# Patient Record
Sex: Female | Born: 1985 | Hispanic: No | Marital: Married | Smoking: Never smoker
Health system: Southern US, Community
[De-identification: ages and names within clinical notes are randomized; demographics above are authoritative.]

## PROBLEM LIST (undated history)

## (undated) DIAGNOSIS — T7840XA Allergy, unspecified, initial encounter: Secondary | ICD-10-CM

## (undated) DIAGNOSIS — F32A Depression, unspecified: Secondary | ICD-10-CM

## (undated) DIAGNOSIS — F329 Major depressive disorder, single episode, unspecified: Secondary | ICD-10-CM

## (undated) DIAGNOSIS — F419 Anxiety disorder, unspecified: Secondary | ICD-10-CM

## (undated) DIAGNOSIS — R87629 Unspecified abnormal cytological findings in specimens from vagina: Secondary | ICD-10-CM

## (undated) HISTORY — DX: Unspecified abnormal cytological findings in specimens from vagina: R87.629

## (undated) HISTORY — DX: Anxiety disorder, unspecified: F41.9

## (undated) HISTORY — DX: Allergy, unspecified, initial encounter: T78.40XA

## (undated) HISTORY — DX: Major depressive disorder, single episode, unspecified: F32.9

## (undated) HISTORY — PX: WISDOM TOOTH EXTRACTION: SHX21

## (undated) HISTORY — DX: Depression, unspecified: F32.A

---

## 2012-06-15 ENCOUNTER — Ambulatory Visit (INDEPENDENT_AMBULATORY_CARE_PROVIDER_SITE_OTHER): Payer: No Typology Code available for payment source | Admitting: Family Medicine

## 2012-06-15 VITALS — BP 122/76 | HR 71 | Temp 98.6°F | Resp 16 | Ht 62.8 in | Wt 117.0 lb

## 2012-06-15 DIAGNOSIS — Z23 Encounter for immunization: Secondary | ICD-10-CM

## 2012-06-15 DIAGNOSIS — Z789 Other specified health status: Secondary | ICD-10-CM

## 2012-06-15 MED ORDER — TETANUS-DIPHTH-ACELL PERTUSSIS 5-2.5-18.5 LF-MCG/0.5 IM SUSP: 0.5000 mL | Freq: Once | INTRAMUSCULAR | Status: DC

## 2012-06-15 MED ORDER — HEPATITIS A VACCINE 1440 EL U/ML IM SUSP: 1.0000 mL | Freq: Once | INTRAMUSCULAR | Status: DC

## 2012-06-15 MED ORDER — TYPHOID VACCINE PO CPDR: 1.0000 | DELAYED_RELEASE_CAPSULE | ORAL | Status: DC

## 2012-06-15 MED ORDER — CHLOROQUINE PHOSPHATE 500 MG PO TABS: ORAL_TABLET | ORAL | Status: DC

## 2012-06-15 NOTE — Progress Notes (Signed)
[facility] [address] [place] [address] [phone]  Date:  06/15/2012   Name:  Daveah Varone   DOB:  Apr 18, 1985   MRN:  [id number]  PCP:  Nilda Simmer, MD    Chief Complaint: Immunizations   History of Present Illness:  Susette Seminara is a 27 y.o. very pleasant female patient who presents with the following:  She is leaving for [place] on a mission trip on 07/03/12.  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 [place]   She has no drug allergies.  She was recently diagnosed with Raynaud's syndrome- thought to be primary and not due to any other problem . She was given a BP medication, but decided not to take it because her symptoms are not severe unless her hands get cold.    Her shots were updated prior to college  She has had the Hep B series, and gardasil.  She is unsure of the date of her last tetanus shot.    LMP 05/18/12  There is no problem list on file for this patient.   Past Medical History  Diagnosis Date  . Allergy   . Depression   . Anxiety     Past Surgical History  Procedure Laterality Date  . Wisdom tooth extraction      History  Substance Use Topics  . Smoking status: Never Smoker   . Smokeless tobacco: Not on file  . Alcohol Use: Yes    Family History  Problem Relation Age of Onset  . Hypertension Father   . Cancer Maternal Grandmother   . Liver disease Maternal Grandfather   . Emphysema Paternal Grandmother   . Lung cancer Paternal Grandfather     No Known Allergies  Medication list has been reviewed and updated.  No current outpatient prescriptions on file prior to visit.   No current facility-administered medications on file prior to visit.    Review of Systems:  As per HPI- otherwise negative. Reports she was told to get malaria prophylaxis, and would like typhoid prophylaxis.    Physical  Examination: Filed Vitals:   06/15/12 0813  BP: 122/76  Pulse: 71  Temp: 98.6 F (37 C)  Resp: 16   Filed Vitals:   06/15/12 0813  Height: 5' 2.8" (1.595 m)  Weight: 117 lb (53.071 kg)   Body mass index is 20.86 kg/(m^2). Ideal Body Weight: Weight in (lb) to have BMI = 25: 139.9  GEN: WDWN, NAD, Non-toxic, A & O x 3 HEENT: Atraumatic, Normocephalic. Neck supple. No masses, No LAD. Ears and Nose: No external deformity. CV: RRR, No M/G/R. No JVD. No thrill. No extra heart sounds. PULM: CTA B, no wheezes, crackles, rhonchi. No retractions. No resp. distress. No accessory muscle use. EXTR: No c/c/e NEURO Normal gait.  PSYCH: Normally interactive. Conversant. Not depressed or anxious appearing.  Calm demeanor.    Assessment and Plan: Travel foreign - Plan: typhoid (VIVOTIF) DR capsule, hepatitis A virus (PF) vaccine (HAVRIX (PF)) 1440 EL U/ML injection 1,440 Units, TDaP (BOOSTRIX) injection 0.5 mL, chloroquine (ARALEN) 500 MG tablet  Updated Tdap today as she is not sure of the date of her last shot.  Hep A #1 given- she is aware this is a 2 shot series Rx oral typhoid vaccine and malaria prophylaxis with chloroquine.  Advised to avoid contact with wild or domestic animals.  Meds ordered this encounter  Medications  . etonogestrel-ethinyl estradiol (NUVARING) 0.12-0.015 MG/24HR vaginal ring    Sig: Place 1 each vaginally every 28 (twenty-eight) days. Insert vaginally and leave in place for 3 consecutive weeks, then remove for 1 week.  . typhoid (VIVOTIF) DR capsule    Sig: Take 1 capsule by mouth every other day.    Dispense:  4 capsule    Refill:  0  . hepatitis A virus (PF) vaccine (HAVRIX (PF)) 1440 EL U/ML injection 1,440 Units    Sig:   . TDaP (BOOSTRIX) injection 0.5 mL    Sig:   . chloroquine (ARALEN) 500 MG tablet    Sig: Take 1 tablet weekly, begin 2 weeks prior to travel and complete 8 weeks after travel. Take for 4 weeks after returning home    Dispense:  10  tablet    Refill:  0    Signed Abbe Amsterdam, MD

## 2012-06-15 NOTE — Patient Instructions (Addendum)
Start on the oral typhoid vaccine right away.  Start the chloroquine one or two weeks prior to travel.  Continue for FOUR weeks after getting home

## 2012-12-09 ENCOUNTER — Telehealth: Payer: Self-pay

## 2012-12-09 ENCOUNTER — Ambulatory Visit (INDEPENDENT_AMBULATORY_CARE_PROVIDER_SITE_OTHER): Payer: No Typology Code available for payment source | Admitting: Internal Medicine

## 2012-12-09 VITALS — BP 116/70 | HR 73 | Temp 99.1°F | Resp 16 | Ht 62.5 in | Wt 121.6 lb

## 2012-12-09 DIAGNOSIS — IMO0002 Reserved for concepts with insufficient information to code with codable children: Secondary | ICD-10-CM

## 2012-12-09 DIAGNOSIS — B379 Candidiasis, unspecified: Secondary | ICD-10-CM

## 2012-12-09 DIAGNOSIS — R8271 Bacteriuria: Secondary | ICD-10-CM

## 2012-12-09 DIAGNOSIS — R6889 Other general symptoms and signs: Secondary | ICD-10-CM

## 2012-12-09 DIAGNOSIS — H9210 Otorrhea, unspecified ear: Secondary | ICD-10-CM

## 2012-12-09 DIAGNOSIS — Z0000 Encounter for general adult medical examination without abnormal findings: Secondary | ICD-10-CM

## 2012-12-09 DIAGNOSIS — H9221 Otorrhagia, right ear: Secondary | ICD-10-CM

## 2012-12-09 DIAGNOSIS — J45990 Exercise induced bronchospasm: Secondary | ICD-10-CM

## 2012-12-09 DIAGNOSIS — Z23 Encounter for immunization: Secondary | ICD-10-CM

## 2012-12-09 LAB — POCT CBC
Granulocyte percent: 58.9 %G (ref 37–80)
HCT, POC: 43.2 % (ref 37.7–47.9)
Hemoglobin: 13.8 g/dL (ref 12.2–16.2)
Lymph, poc: 2 (ref 0.6–3.4)
MCHC: 31.9 g/dL (ref 31.8–35.4)
MPV: 8.6 fL (ref 0–99.8)
POC Granulocyte: 3.3 (ref 2–6.9)
POC MID %: 5.8 %M (ref 0–12)
RDW, POC: 13.5 %

## 2012-12-09 LAB — POCT URINALYSIS DIPSTICK
Bilirubin, UA: NEGATIVE
Glucose, UA: NEGATIVE
Spec Grav, UA: 1.025
Urobilinogen, UA: 0.2

## 2012-12-09 LAB — COMPREHENSIVE METABOLIC PANEL
AST: 17 U/L (ref 0–37)
Albumin: 4.5 g/dL (ref 3.5–5.2)
BUN: 11 mg/dL (ref 6–23)
CO2: 25 mEq/L (ref 19–32)
Calcium: 9.9 mg/dL (ref 8.4–10.5)
Chloride: 102 mEq/L (ref 96–112)
Glucose, Bld: 77 mg/dL (ref 70–99)
Potassium: 4.5 mEq/L (ref 3.5–5.3)

## 2012-12-09 LAB — POCT UA - MICROSCOPIC ONLY
Casts, Ur, LPF, POC: NEGATIVE
Mucus, UA: POSITIVE

## 2012-12-09 LAB — POCT WET PREP WITH KOH: Yeast Wet Prep HPF POC: NEGATIVE

## 2012-12-09 LAB — GLUCOSE, POCT (MANUAL RESULT ENTRY): POC Glucose: 69 mg/dl — AB (ref 70–99)

## 2012-12-09 LAB — LIPID PANEL
Cholesterol: 170 mg/dL (ref 0–200)
HDL: 76 mg/dL (ref >39)

## 2012-12-09 MED ORDER — CIPROFLOXACIN HCL 500 MG PO TABS: 500.0000 mg | ORAL_TABLET | Freq: Two times a day (BID) | ORAL | Status: DC

## 2012-12-09 MED ORDER — FLUCONAZOLE 150 MG PO TABS: 150.0000 mg | ORAL_TABLET | Freq: Once | ORAL | Status: DC

## 2012-12-09 MED ORDER — ETONOGESTREL-ETHINYL ESTRADIOL 0.12-0.015 MG/24HR VA RING: VAGINAL_RING | VAGINAL | Status: DC

## 2012-12-09 MED ORDER — ALBUTEROL SULFATE HFA 108 (90 BASE) MCG/ACT IN AERS: 2.0000 | INHALATION_SPRAY | Freq: Four times a day (QID) | RESPIRATORY_TRACT | Status: DC | PRN

## 2012-12-09 NOTE — Progress Notes (Signed)
  Subjective:    Patient ID: Jean Dickerson, female    DOB: 1986-01-01, 27 y.o.   MRN: [id number]  HPI    Review of Systems     Objective:   Physical Exam        Assessment & Plan:

## 2012-12-09 NOTE — Patient Instructions (Addendum)
Health Maintenance, Females A healthy lifestyle and preventative care can promote health and wellness.  Maintain regular health, dental, and eye exams.  Eat a healthy diet. Foods like vegetables, fruits, whole grains, low-fat dairy products, and lean protein foods contain the nutrients you need without too many calories. Decrease your intake of foods high in solid fats, added sugars, and salt. Get information about a proper diet from your caregiver, if necessary.  Regular physical exercise is one of the most important things you can do for your health. Most adults should get at least 150 minutes of moderate-intensity exercise (any activity that increases your heart rate and causes you to sweat) each week. In addition, most adults need muscle-strengthening exercises on 2 or more days a week.   Maintain a healthy weight. The body mass index (BMI) is a screening tool to identify possible weight problems. It provides an estimate of body fat based on height and weight. Your caregiver can help determine your BMI, and can help you achieve or maintain a healthy weight. For adults 20 years and older:  A BMI below 18.5 is considered underweight.  A BMI of 18.5 to 24.9 is normal.  A BMI of 25 to 29.9 is considered overweight.  A BMI of 30 and above is considered obese.  Maintain normal blood lipids and cholesterol by exercising and minimizing your intake of saturated fat. Eat a balanced diet with plenty of fruits and vegetables. Blood tests for lipids and cholesterol should begin at age 20 and be repeated every 5 years. If your lipid or cholesterol levels are high, you are over 50, or you are a high risk for heart disease, you may need your cholesterol levels checked more frequently.Ongoing high lipid and cholesterol levels should be treated with medicines if diet and exercise are not effective.  If you smoke, find out from your caregiver how to quit. If you do not use tobacco, do not start.  If you  are pregnant, do not drink alcohol. If you are breastfeeding, be very cautious about drinking alcohol. If you are not pregnant and choose to drink alcohol, do not exceed 1 drink per day. One drink is considered to be 12 ounces (355 mL) of beer, 5 ounces (148 mL) of wine, or 1.5 ounces (44 mL) of liquor.  Avoid use of street drugs. Do not share needles with anyone. Ask for help if you need support or instructions about stopping the use of drugs.  High blood pressure causes heart disease and increases the risk of stroke. Blood pressure should be checked at least every 1 to 2 years. Ongoing high blood pressure should be treated with medicines, if weight loss and exercise are not effective.  If you are 55 to 26 years old, ask your caregiver if you should take aspirin to prevent strokes.  Diabetes screening involves taking a blood sample to check your fasting blood sugar level. This should be done once every 3 years, after age 45, if you are within normal weight and without risk factors for diabetes. Testing should be considered at a younger age or be carried out more frequently if you are overweight and have at least 1 risk factor for diabetes.  Breast cancer screening is essential preventative care for women. You should practice "breast self-awareness." This means understanding the normal appearance and feel of your breasts and may include breast self-examination. Any changes detected, no matter how small, should be reported to a caregiver. Women in their 20s and 30s should have   a clinical breast exam (CBE) by a caregiver as part of a regular health exam every 1 to 3 years. After age 40, women should have a CBE every year. Starting at age 40, women should consider having a mammogram (breast X-ray) every year. Women who have a family history of breast cancer should talk to their caregiver about genetic screening. Women at a high risk of breast cancer should talk to their caregiver about having an MRI and a  mammogram every year.  The Pap test is a screening test for cervical cancer. Women should have a Pap test starting at age 21. Between ages 21 and 29, Pap tests should be repeated every 2 years. Beginning at age 30, you should have a Pap test every 3 years as long as the past 3 Pap tests have been normal. If you had a hysterectomy for a problem that was not cancer or a condition that could lead to cancer, then you no longer need Pap tests. If you are between ages 65 and 70, and you have had normal Pap tests going back 10 years, you no longer need Pap tests. If you have had past treatment for cervical cancer or a condition that could lead to cancer, you need Pap tests and screening for cancer for at least 20 years after your treatment. If Pap tests have been discontinued, risk factors (such as a new sexual partner) need to be reassessed to determine if screening should be resumed. Some women have medical problems that increase the chance of getting cervical cancer. In these cases, your caregiver may recommend more frequent screening and Pap tests.  The human papillomavirus (HPV) test is an additional test that may be used for cervical cancer screening. The HPV test looks for the virus that can cause the cell changes on the cervix. The cells collected during the Pap test can be tested for HPV. The HPV test could be used to screen women aged 30 years and older, and should be used in women of any age who have unclear Pap test results. After the age of 30, women should have HPV testing at the same frequency as a Pap test.  Colorectal cancer can be detected and often prevented. Most routine colorectal cancer screening begins at the age of 50 and continues through age 75. However, your caregiver may recommend screening at an earlier age if you have risk factors for colon cancer. On a yearly basis, your caregiver may provide home test kits to check for hidden blood in the stool. Use of a small camera at the end of a  tube, to directly examine the colon (sigmoidoscopy or colonoscopy), can detect the earliest forms of colorectal cancer. Talk to your caregiver about this at age 50, when routine screening begins. Direct examination of the colon should be repeated every 5 to 10 years through age 75, unless early forms of pre-cancerous polyps or small growths are found.  Hepatitis C blood testing is recommended for all people born from 1945 through 1965 and any individual with known risks for hepatitis C.  Practice safe sex. Use condoms and avoid high-risk sexual practices to reduce the spread of sexually transmitted infections (STIs). Sexually active women aged 25 and younger should be checked for Chlamydia, which is a common sexually transmitted infection. Older women with new or multiple partners should also be tested for Chlamydia. Testing for other STIs is recommended if you are sexually active and at increased risk.  Osteoporosis is a disease in which the   bones lose minerals and strength with aging. This can result in serious bone fractures. The risk of osteoporosis can be identified using a bone density scan. Women ages 72 and over and women at risk for fractures or osteoporosis should discuss screening with their caregivers. Ask your caregiver whether you should be taking a calcium supplement or vitamin D to reduce the rate of osteoporosis.  Menopause can be associated with physical symptoms and risks. Hormone replacement therapy is available to decrease symptoms and risks. You should talk to your caregiver about whether hormone replacement therapy is right for you.  Use sunscreen with a sun protection factor (SPF) of 30 or greater. Apply sunscreen liberally and repeatedly throughout the day. You should seek shade when your shadow is shorter than you. Protect yourself by wearing long sleeves, pants, a wide-brimmed hat, and sunglasses year round, whenever you are outdoors.  Notify your caregiver of new moles or  changes in moles, especially if there is a change in shape or color. Also notify your caregiver if a mole is larger than the size of a pencil eraser.  Stay current with your immunizations. Document Released: 08/26/2010 Document Revised: 05/05/2011 Document Reviewed: 08/26/2010 [facility] Patient Information 2014 [place], [place]. Monilial Vaginitis Vaginitis in a soreness, swelling and redness (inflammation) of the vagina and vulva. Monilial vaginitis is not a sexually transmitted infection. CAUSES  Yeast vaginitis is caused by yeast (candida) that is normally found in your vagina. With a yeast infection, the candida has overgrown in number to a point that upsets the chemical balance. SYMPTOMS   White, thick vaginal discharge.  Swelling, itching, redness and irritation of the vagina and possibly the lips of the vagina (vulva).  Burning or painful urination.  Painful intercourse. DIAGNOSIS  Things that may contribute to monilial vaginitis are:  Postmenopausal and virginal states.  Pregnancy.  Infections.  Being tired, sick or stressed, especially if you had monilial vaginitis in the past.  Diabetes. Good control will help lower the chance.  Birth control pills.  Tight fitting garments.  Using bubble bath, feminine sprays, douches or deodorant tampons.  Taking certain medications that kill germs (antibiotics).  Sporadic recurrence can occur if you become ill. TREATMENT  Your caregiver will give you medication.  There are several kinds of anti monilial vaginal creams and suppositories specific for monilial vaginitis. For recurrent yeast infections, use a suppository or cream in the vagina 2 times a week, or as directed.  Anti-monilial or steroid cream for the itching or irritation of the vulva may also be used. Get your caregiver's permission.  Painting the vagina with methylene blue solution may help if the monilial cream does not work.  Eating yogurt may help prevent  monilial vaginitis. HOME CARE INSTRUCTIONS   Finish all medication as prescribed.  Do not have sex until treatment is completed or after your caregiver tells you it is okay.  Take warm sitz baths.  Do not douche.  Do not use tampons, especially scented ones.  Wear cotton underwear.  Avoid tight pants and panty hose.  Tell your sexual partner that you have a yeast infection. They should go to their caregiver if they have symptoms such as mild rash or itching.  Your sexual partner should be treated as well if your infection is difficult to eliminate.  Practice safer sex. Use condoms.  Some vaginal medications cause latex condoms to fail. Vaginal medications that harm condoms are:  Cleocin cream.  Butoconazole (Femstat).  Terconazole (Terazol) vaginal suppository.  Miconazole (Monistat) (  may be purchased over the counter). SEEK MEDICAL CARE IF:   You have a temperature by mouth above 102 F (38.9 C).  The infection is getting worse after 2 days of treatment.  The infection is not getting better after 3 days of treatment.  You develop blisters in or around your vagina.  You develop vaginal bleeding, and it is not your menstrual period.  You have pain when you urinate.  You develop intestinal problems.  You have pain with sexual intercourse. Document Released: 11/20/2004 Document Revised: 05/05/2011 Document Reviewed: 08/04/2008 [facility] Patient Information 2014 [place], [place]. Exercise-Induced Asthma  Asthma is a condition in which the airways in the lungs (bronchioles) tend to constrict more than normal due to muscle spasms. This constriction results in difficulty in breathing (shortness of breath, wheezing, or coughing). For some people the symptoms are caused or triggered by physical activity; this is known as exercise-induced asthma. SYMPTOMS   Shortness of breath.  Wheezing.  Coughing.  Chest tightness.  Decrease in optimal  performance.  Fatigue. POSSIBLE TRIGGERS: Exercise-induced asthma may occur more often when one or more of the following are present:   Animal dander from the skin, hair, or feathers of animals.  Dust mites contained in house dust.  Cockroaches.  Pollen from trees or grass.  Mold.  Cigarette or tobacco smoke. Smoking cannot be allowed in homes of people with asthma. People with asthma should not smoke and should not be around smokers.  Air pollutants such as dust, household cleaners, hair sprays, aerosol sprays, paint fumes, strong chemicals, or strong odors.  Cold air or weather changes. Cold air may cause inflammation. Winds increase molds and pollens in the air. There is not one best climate for people with asthma.  Strong emotions such as crying or laughing hard.  Stress.  Certain medicines such as aspirin or beta-blockers.  Sulfites in such foods and drinks as dried fruits and wine.  Infections or inflammatory conditions such as the flu, a cold, or an inflammation of the nasal membranes (rhinitis).  Gastroesophageal reflux disease (GERD). GERD is a condition where stomach acid backs up into your throat (esophagus).  Exercise or strenous activity. Proper pre-exercise medicines allow most people to participate in sports. PREVENTION   Know the triggers that may increase your occurrence for exercise-induced asthma and avoid them.  During winter you may need to exercise indoors or wear a mask if you do exercise outdoors.  Breathing through the nose instead of the mouth, especially in the winter.  Warm-up for an appropriate length of time before a vigorous workout.  Take controller and reliever medicines to control your asthma as directed.  Follow-up with your caregiver as directed. TREATMENT  Asthma controller and reliever medicines work well for most people suffering from exercise-induced asthma. Medicines are able to both prevent asthma attack, as well as treat  attacks already happening. The most common type of medicine for asthma is called a bronchodilator. Bronchodilators act by expanding the constricted airways. The most common type of bronchodilator is albuterol and should be taken 15 to 30 minutes before physical activity and as soon as symptoms begin to appear. Additional medicines, such as cromolyn and nedocromil, may be prescribed by your caregiver. It is important for all people with asthma to use their medicines as directed by their caregiver. Document Released: 02/10/2005 Document Revised: 01/28/2012 Document Reviewed: 05/25/2008 [facility] Patient Information 2014 [place], [place].

## 2012-12-09 NOTE — Telephone Encounter (Signed)
She called when I was at lunch, I called her back. Patent states she did not give a clean catch urine. Advised her she should take the meds for the UTI, as U/A shows very positive for UTI. Advised her also we will call with the culture results, and advise if anything further is shown, and which bacteria grows. She voiced understanding.

## 2012-12-09 NOTE — Progress Notes (Signed)
Subjective:    Patient ID: Jean Dickerson, female    DOB: 11-29-1985, 27 y.o.   MRN: [id number]  HPI 27 y.o. Female presents to clinic for general physical . Has been having trouble with yeast. States that its constant. Feels like she hasn't found anything that seems to help. Has not been taking antibiotics recently and denies being diabetic. Has had troubles with sinus headaches and stuffiness. Has been consistent for a while. Not currently taking any medications for this. Hears wheezing after hard workouts.   Last pap smear was in Nov 2013; states that they have been abnormal. Is needing refills on nuvaring as well. Is also recieveing second Hep A vaccine today as well; traveled to [place]. Last Hep A was 06/15/12. Paps smears were improving no bx needed. Dr. Beather Arbour her gyn  Right ear has been having some discharge; uses q-tips.   Has strong family history of colon cancer. Family hx of breast cancer . Had gardisil x3.   Review of Systems  Constitutional: Negative.   HENT: Positive for rhinorrhea.   Eyes: Negative.   Respiratory: Positive for wheezing.   Cardiovascular: Negative.   Gastrointestinal: Negative.   Endocrine: Negative.   Genitourinary: Negative.   Musculoskeletal: Negative.   Skin: Negative.   Allergic/Immunologic: Positive for environmental allergies.  Neurological: Negative.   Hematological: Negative.   Psychiatric/Behavioral: Negative.        Objective:   Physical Exam  Vitals reviewed. Constitutional: She is oriented to person, place, and time. She appears well-developed and well-nourished.  HENT:  Head: Normocephalic.  Right Ear: External ear normal.  Left Ear: External ear normal.  Nose: Nose normal.  Mouth/Throat: Oropharynx is clear and moist.  Eyes: Conjunctivae and EOM are normal. Pupils are equal, round, and reactive to light.  Neck: Normal range of motion. Neck supple. No tracheal deviation present. No thyromegaly present.   Cardiovascular: Normal rate, regular rhythm, normal heart sounds and intact distal pulses.   Pulmonary/Chest: Effort normal.  Abdominal: Soft. She exhibits no distension. There is no tenderness. There is no rebound and no guarding.  Genitourinary: Vagina normal and uterus normal.  Musculoskeletal: Normal range of motion. She exhibits no edema and no tenderness.  Neurological: She is alert and oriented to person, place, and time. She has normal reflexes. No cranial nerve deficit. She exhibits normal muscle tone. Coordination normal.  Psychiatric: She has a normal mood and affect. Her behavior is normal. Judgment and thought content normal.   Results for orders placed in visit on 12/09/12  GLUCOSE, POCT (MANUAL RESULT ENTRY)      Result Value Range   POC Glucose 69 (*) 70 - 99 mg/dl  POCT UA - MICROSCOPIC ONLY      Result Value Range   WBC, Ur, HPF, POC 18-21     RBC, urine, microscopic 4-6     Bacteria, U Microscopic 5+     Mucus, UA pos     Epithelial cells, urine per micros 4-6     Crystals, Ur, HPF, POC neg     Casts, Ur, LPF, POC neg     Yeast, UA neg    POCT URINALYSIS DIPSTICK      Result Value Range   Color, UA yellow     Clarity, UA cloudy     Glucose, UA neg     Bilirubin, UA neg     Ketones, UA trace     Spec Grav, UA 1.025     Blood, UA trace  pH, UA 6.0     Protein, UA neg     Urobilinogen, UA 0.2     Nitrite, UA neg     Leukocytes, UA Trace    POCT WET PREP WITH KOH      Result Value Range   Trichomonas, UA Negative     Clue Cells Wet Prep HPF POC 0-2     Epithelial Wet Prep HPF POC 0-2     Yeast Wet Prep HPF POC neg     Bacteria Wet Prep HPF POC 5+     RBC Wet Prep HPF POC 3-5     WBC Wet Prep HPF POC 12-18     KOH Prep POC Negative    POCT CBC      Result Value Range   WBC 5.6  4.6 - 10.2 K/uL   Lymph, poc 2.0  0.6 - 3.4   POC LYMPH PERCENT 35.3  10 - 50 %L   MID (cbc) 0.3  0 - 0.9   POC MID % 5.8  0 - 12 %M   POC Granulocyte 3.3  2 - 6.9    Granulocyte percent 58.9  37 - 80 %G   RBC 4.73  4.04 - 5.48 M/uL   Hemoglobin 13.8  12.2 - 16.2 g/dL   HCT, POC 40.9  81.1 - 47.9 %   MCV 91.3  80 - 97 fL   MCH, POC 29.2  27 - 31.2 pg   MCHC 31.9  31.8 - 35.4 g/dL   RDW, POC 91.4     Platelet Count, POC 288  142 - 424 K/uL   MPV 8.6  0 - 99.8 fL          Assessment & Plan:  HX for EIA/Albuterol prn Hx improving abnormal paps smears

## 2012-12-09 NOTE — Telephone Encounter (Signed)
Left message for her to call me back. 

## 2012-12-09 NOTE — Telephone Encounter (Signed)
Patient calling Amy L back. No answer or at clinical TL. Told her we would call her back  [phone]

## 2012-12-09 NOTE — Telephone Encounter (Signed)
Patient has questions reqarding her ov this morning.   [phone]

## 2012-12-11 LAB — URINE CULTURE
Colony Count: NO GROWTH
Organism ID, Bacteria: NO GROWTH

## 2012-12-13 LAB — PAP IG, CT-NG, RFX HPV ASCU: Chlamydia Probe Amp: NEGATIVE

## 2013-05-20 ENCOUNTER — Ambulatory Visit (INDEPENDENT_AMBULATORY_CARE_PROVIDER_SITE_OTHER): Payer: BC Managed Care – PPO | Admitting: Physician Assistant

## 2013-05-20 VITALS — BP 110/72 | HR 85 | Temp 98.4°F | Resp 16 | Ht 62.75 in | Wt 123.8 lb

## 2013-05-20 DIAGNOSIS — F329 Major depressive disorder, single episode, unspecified: Secondary | ICD-10-CM

## 2013-05-20 DIAGNOSIS — IMO0002 Reserved for concepts with insufficient information to code with codable children: Secondary | ICD-10-CM

## 2013-05-20 DIAGNOSIS — F3289 Other specified depressive episodes: Secondary | ICD-10-CM

## 2013-05-20 MED ORDER — CIPROFLOXACIN HCL 500 MG PO TABS: 500.0000 mg | ORAL_TABLET | Freq: Two times a day (BID) | ORAL | Status: DC

## 2013-05-20 MED ORDER — ACETAZOLAMIDE 125 MG PO TABS: 125.0000 mg | ORAL_TABLET | Freq: Two times a day (BID) | ORAL | Status: DC

## 2013-05-20 MED ORDER — ATOVAQUONE-PROGUANIL HCL 250-100 MG PO TABS: 1.0000 | ORAL_TABLET | Freq: Every day | ORAL | Status: DC

## 2013-05-20 MED ORDER — NYSTATIN-TRIAMCINOLONE 100000-0.1 UNIT/GM-% EX OINT: 1.0000 "application " | TOPICAL_OINTMENT | Freq: Two times a day (BID) | CUTANEOUS | Status: DC

## 2013-05-20 NOTE — Progress Notes (Signed)
   Subjective:    Patient ID: Jean Dickerson, female    DOB: 1985-11-19, 28 y.o.   MRN: [id number]  HPI 28 year old female presents for immunization counseling for international travel.  She will be traveling to [place] in June for a mission trip.  Went to [place] last year and was seen here for immunization counseling.  Had Hepatitis B vaccines, vivotif, updated Tdap, and placed on chloroquine for malaria prophylaxis.  She has now had 2 doses of Hepatitis A vaccine. She will be in [place] for 9 days and will spend at least 2 of those days at high altitude where medication is recommended.  She is healthy with no known medical problems.   Also requesting refill of nystatin/triamcinolone ointment that her GYN gives here for intermittent vaginal itching.   Review of Systems  Constitutional: Negative for fever and chills.  Respiratory: Negative for cough.   Neurological: Negative for headaches.       Objective:   Physical Exam  Constitutional: She is oriented to person, place, and time. She appears well-developed and well-nourished.  HENT:  Head: Normocephalic and atraumatic.  Right Ear: External ear normal.  Left Ear: External ear normal.  Eyes: Conjunctivae are normal.  Neck: Normal range of motion.  Cardiovascular: Normal rate.   Pulmonary/Chest: Effort normal.  Neurological: She is alert and oriented to person, place, and time.  Psychiatric: She has a normal mood and affect. Her behavior is normal. Judgment and thought content normal.          Assessment & Plan:  Advice or immunization for travel - Plan: acetaZOLAMIDE (DIAMOX) 125 MG tablet, atovaquone-proguanil (MALARONE) 250-100 MG TABS, ciprofloxacin (CIPRO) 500 MG tablet, nystatin-triamcinolone ointment (MYCOLOG)  Start Malarone 1-2 days before travel, continue daily while abroad, then for 7 days upon return Cipro 500 mg bid x 1-2 days if she develops diarrhea Acetazolamide 125 mg bid to start 24 hours prior to ascent, and  continue for duration.  Information provided for patient to get yellow fever vaccine per her request.  Refilled mycolog ointment to use prn Follow up here as needed.

## 2013-05-20 NOTE — Patient Instructions (Signed)
1---[facility] [address], [address] [place], [place] [address] [phone]   2---[facility] [address] [place], [place] [address] [phone]   [facility] [address] [place], [place] [address] [phone]  [facility] [address] [place], [place] [address] [phone]

## 2014-04-07 ENCOUNTER — Ambulatory Visit (INDEPENDENT_AMBULATORY_CARE_PROVIDER_SITE_OTHER): Payer: BLUE CROSS/BLUE SHIELD | Admitting: Family Medicine

## 2014-04-07 VITALS — BP 92/70 | HR 96 | Temp 98.5°F | Resp 18 | Ht 63.0 in | Wt 123.0 lb

## 2014-04-07 DIAGNOSIS — Z0000 Encounter for general adult medical examination without abnormal findings: Secondary | ICD-10-CM

## 2014-04-07 DIAGNOSIS — N898 Other specified noninflammatory disorders of vagina: Secondary | ICD-10-CM

## 2014-04-07 DIAGNOSIS — Z3009 Encounter for other general counseling and advice on contraception: Secondary | ICD-10-CM

## 2014-04-07 DIAGNOSIS — B9689 Other specified bacterial agents as the cause of diseases classified elsewhere: Secondary | ICD-10-CM

## 2014-04-07 DIAGNOSIS — A499 Bacterial infection, unspecified: Secondary | ICD-10-CM

## 2014-04-07 DIAGNOSIS — Z7189 Other specified counseling: Secondary | ICD-10-CM

## 2014-04-07 DIAGNOSIS — IMO0002 Reserved for concepts with insufficient information to code with codable children: Secondary | ICD-10-CM

## 2014-04-07 DIAGNOSIS — N760 Acute vaginitis: Secondary | ICD-10-CM

## 2014-04-07 DIAGNOSIS — L709 Acne, unspecified: Secondary | ICD-10-CM

## 2014-04-07 LAB — POCT WET PREP WITH KOH
KOH Prep POC: NEGATIVE
Trichomonas, UA: NEGATIVE
Yeast Wet Prep HPF POC: NEGATIVE

## 2014-04-07 MED ORDER — METRONIDAZOLE 500 MG PO TABS: 500.0000 mg | ORAL_TABLET | Freq: Two times a day (BID) | ORAL | Status: DC

## 2014-04-07 MED ORDER — ATOVAQUONE-PROGUANIL HCL 250-100 MG PO TABS: 1.0000 | ORAL_TABLET | Freq: Every day | ORAL | Status: DC

## 2014-04-07 MED ORDER — NORGESTIMATE-ETH ESTRADIOL 0.25-35 MG-MCG PO TABS: ORAL_TABLET | ORAL | Status: DC

## 2014-04-07 NOTE — Patient Instructions (Signed)
Use malaria prophylaxis for [place] as directed  No special vaccines for [place].  See CDC.Gov and look up travel medicine  Take Birth Control pills skipping the placebo pills for 2 out of 3 months.  Take regularly without missing any doses.  Return if problems

## 2014-04-07 NOTE — Progress Notes (Addendum)
Physical examination:  History: 29 year old lady who is here for physical examination. She has a day off and realized she was overdue so she came on in here. She is generally healthy except for she has some problems with acne still. She used to be on the birth-control pill for that, but she came off of it. She has contemplated other means of contraception. She has not been sexually involved for 2-1/2 years since she became convicted as a [employer] that was not the right thing for her. She is scheduled for getting married this summer, and wants to get back on the some for contraception. 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  Past medical history: Operations: Wasn't teeth Medical illnesses: No major illnesses Medication allergies: None Regular medications: None Gravida 0 para 0  Social history: As above. 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 She will be getting married soon. She does not smoke or use drugs. She does occasionally have a drink. She is not getting any regular exercise that she used to.  Family history: Both parents are living and have high cholesterol. Father has some mental illness. Parents are not together.  Review of systems: Constitutional: Unremarkable Respiratory: Unremarkable except for some seasonal allergy causing head congestion HEENT: As above Cardiovascular: Unremarkable GI: Unremarkable GU: Off the birth control pills she says her periods have been lighter but more pain. Does seem to have a chronic discharge with a little itching when she was on the birth-control pills Oscar skeletal: Unremarkable Neurologic: Unremarkable Psychiatric: Some anxiety issues Dermatologic: Unremarkable Endocrinologic: Unremarkable   Physical exam: Well-developed well-nourished young lady in no major distress. TMs normal. Eyes PERRLA. Fundi benign. Throat clear. Teeth good. Neck supple without nodes or thyromegaly. No carotid  bruits. Chest is clear to percussion and auscultation. Heart regular without murmurs gallops or arrhythmias. Abdomen is soft without masses tenderness. Breast exam was normal. No axillary nodes. Pelvic normal external genitalia. Vaginal mucosa unremarkable. Has a little brownish discharge without odor. Bimanual exam feels no adnexal or uterine masses. Rectovaginal not done. Extremities unremarkable. Skin unremarkable except for the acne on her forehead  Assessment: Normal physical examination Contraception discussion Acne vulgari  History of vaginal discharge Anxiety   Plan: Wet prep, Pap 3  Results for orders placed or performed in visit on 04/07/14  POCT Wet Prep with KOH  Result Value Ref Range   Trichomonas, UA Negative    Clue Cells Wet Prep HPF POC 100%    Epithelial Wet Prep HPF POC 0-10 with clumps    Yeast Wet Prep HPF POC neg    Bacteria Wet Prep HPF POC 4+    RBC Wet Prep HPF POC 0-2    WBC Wet Prep HPF POC 10-15    KOH Prep POC Negative      Travel medicine visit:  Discussed immunizations and reviewed travel guidelines for [place]. Also is going to [place] this [place] and went ahead and gave her the prescription for her Malarone for that trip. She has been there before.  Assessment: Travel medicine advice    Addendum: I failed to mention above that she did have BV. We'll place her on metronidazole for that. Discussed that with her but did not give it to her to time of visit. I will have someone call her and tell her that I prescribed it.

## 2014-04-07 NOTE — Addendum Note (Signed)
Addended by: HOPPER, DAVID H on: 04/07/2014 11:25 AM   Modules accepted: Orders

## 2014-04-11 LAB — PAP IG, CT-NG, RFX HPV ASCU
Chlamydia Probe Amp: NEGATIVE
GC Probe Amp: NEGATIVE

## 2014-12-20 ENCOUNTER — Ambulatory Visit (INDEPENDENT_AMBULATORY_CARE_PROVIDER_SITE_OTHER): Payer: BLUE CROSS/BLUE SHIELD | Admitting: Family Medicine

## 2014-12-20 ENCOUNTER — Encounter: Payer: Self-pay | Admitting: Family Medicine

## 2014-12-20 VITALS — BP 125/84 | HR 76 | Temp 99.0°F | Resp 16 | Ht 62.0 in | Wt 129.6 lb

## 2014-12-20 DIAGNOSIS — B373 Candidiasis of vulva and vagina: Secondary | ICD-10-CM | POA: Diagnosis not present

## 2014-12-20 DIAGNOSIS — R3989 Other symptoms and signs involving the genitourinary system: Secondary | ICD-10-CM

## 2014-12-20 DIAGNOSIS — N3090 Cystitis, unspecified without hematuria: Secondary | ICD-10-CM

## 2014-12-20 DIAGNOSIS — N3289 Other specified disorders of bladder: Secondary | ICD-10-CM | POA: Diagnosis not present

## 2014-12-20 DIAGNOSIS — B3731 Acute candidiasis of vulva and vagina: Secondary | ICD-10-CM

## 2014-12-20 LAB — POCT URINALYSIS DIP (MANUAL ENTRY)
BILIRUBIN UA: NEGATIVE
BILIRUBIN UA: NEGATIVE
Glucose, UA: NEGATIVE
Nitrite, UA: NEGATIVE
Protein Ur, POC: NEGATIVE
Spec Grav, UA: 1.01
Urobilinogen, UA: 0.2
pH, UA: 7

## 2014-12-20 LAB — POC MICROSCOPIC URINALYSIS (UMFC): Mucus: ABSENT

## 2014-12-20 MED ORDER — CIPROFLOXACIN HCL 500 MG PO TABS: 500.0000 mg | ORAL_TABLET | Freq: Two times a day (BID) | ORAL | Status: DC

## 2014-12-20 MED ORDER — FLUCONAZOLE 150 MG PO TABS: 150.0000 mg | ORAL_TABLET | Freq: Once | ORAL | Status: DC

## 2014-12-20 NOTE — Progress Notes (Signed)
   Subjective:    Patient ID: Jean Dickerson, female    DOB: 07/16/1985, 29 y.o.   MRN: [id number]  HPI Patient presents today with 2 day history of urinary frequency, 1 day of bladder pressure. She took one dose of cipro 500 mg last night and 1 dose this morning with good relief. She travels overseas for mission work and has cipro for traveler's diarrhea. She has a history of frequent UTIs, but has not had one in several months. She has had urology work up in the past and testing was negative. She has been married for 2 months and has been very careful about voiding after intercourse and is taking cranberry gummies and drinks a lot of water.   Past Medical History  Diagnosis Date  . Allergy   . Depression   . Anxiety    Past Surgical History  Procedure Laterality Date  . Wisdom tooth extraction     Family History  Problem Relation Age of Onset  . Hypertension Father   . Mental illness Father   . Cancer Maternal Grandmother   . Liver disease Maternal Grandfather   . Emphysema Paternal Grandmother   . Lung cancer Paternal Grandfather   . Hypertension Mother   . Hyperlipidemia Mother    Social History  Substance Use Topics  . Smoking status: Never Smoker   . Smokeless tobacco: None  . Alcohol Use: 2.4 oz/week    2 Glasses of wine, 2 Cans of beer per week    Review of Systems Temp to 99, chills earlier, no dysuria, no hematuria, no flank pain, no nausea or vomiting.    Objective:   Physical Exam Physical Exam  Constitutional: Oriented to person, place, and time. She appears well-developed and well-nourished.  HENT:  Head: Normocephalic and atraumatic.  Eyes: Conjunctivae are normal.  Neck: Normal range of motion. Neck supple.  Cardiovascular: Normal rate, regular rhythm and normal heart sounds.   Pulmonary/Chest: Effort normal and breath sounds normal.  Abdomen: no tenderness to palpation. No CVA tenderness. Musculoskeletal: Normal range of motion.  Neurological:  Alert and oriented to person, place, and time.  Skin: Skin is warm and dry.  Psychiatric: Normal mood and affect. Behavior is normal. Judgment and thought content normal.  Vitals reviewed. BP 125/84 mmHg  Pulse 76  Temp(Src) 99 F (37.2 C) (Oral)  Resp 16  Ht 5\' 2"  (1.575 m)  Wt 129 lb 9.6 oz (58.786 kg)  BMI 23.70 kg/m2  LMP 12/13/2014 Wt Readings from Last 3 Encounters:  12/20/14 129 lb 9.6 oz (58.786 kg)  04/07/14 123 lb (55.792 kg)  05/20/13 123 lb 12.8 oz (56.155 kg)      Assessment & Plan:  1. Sensation of pressure in bladder area - POCT urinalysis dipstick - POCT Microscopic Urinalysis (UMFC)  2. Cystitis - this is a recurrent problem for the patient, discussed potential need for post coital prophylaxis if frequent UTIs.  - ciprofloxacin (CIPRO) 500 MG tablet; Take 1 tablet (500 mg total) by mouth 2 (two) times daily.  Dispense: 10 tablet; Refill: 0 - culture not ordered since antibiotic therapy already started  3. Vaginal yeast infection - fluconazole (DIFLUCAN) 150 MG tablet; Take 1 tablet (150 mg total) by mouth once. Repeat if needed  Dispense: 2 tablet; Refill: 0  - RTC precautions given  Olean Reeeborah Gessner, FNP-BC  [facility]  12/20/2014 5:17 PM

## 2014-12-20 NOTE — Patient Instructions (Signed)

## 2015-02-15 ENCOUNTER — Encounter: Payer: Self-pay | Admitting: Physician Assistant

## 2015-02-15 ENCOUNTER — Ambulatory Visit (INDEPENDENT_AMBULATORY_CARE_PROVIDER_SITE_OTHER): Payer: BLUE CROSS/BLUE SHIELD | Admitting: Physician Assistant

## 2015-02-15 VITALS — BP 125/75 | HR 69 | Temp 98.7°F | Resp 16 | Ht 62.0 in | Wt 127.6 lb

## 2015-02-15 DIAGNOSIS — N921 Excessive and frequent menstruation with irregular cycle: Secondary | ICD-10-CM

## 2015-02-15 DIAGNOSIS — Z3041 Encounter for surveillance of contraceptive pills: Secondary | ICD-10-CM

## 2015-02-15 DIAGNOSIS — L709 Acne, unspecified: Secondary | ICD-10-CM | POA: Insufficient documentation

## 2015-02-15 LAB — POCT WET + KOH PREP
Trich by wet prep: ABSENT
Yeast by KOH: ABSENT
Yeast by wet prep: ABSENT

## 2015-02-15 LAB — POCT URINE PREGNANCY: Preg Test, Ur: NEGATIVE

## 2015-02-15 MED ORDER — LEVONORGESTREL-ETHINYL ESTRAD 0.1-20 MG-MCG PO TABS: 1.0000 | ORAL_TABLET | Freq: Every day | ORAL | Status: DC

## 2015-02-15 NOTE — Progress Notes (Signed)
Jean Dickerson  MRN: [id number] DOB: 1985/06/24  Subjective:  Pt presents to clinic with irregular menses.  She started OCP about 8 months ago and has has some irregular spotting since then.  This cycle she had a normal cycle and then yesterday (2 weeks into her cycle) she started having cramping and bleeding - more like a period but at the wrong time.  She is having cramping some months and other months she is fine.  She is newly married sexually active with only her husband - she had been abstinent for several years before that. She believes he had STD testing prior to their wedding.  She has not missed any pills this month.  She did miss 1 last month.  Patient Active Problem List   Diagnosis Date Noted  . Acne 02/15/2015    No current outpatient prescriptions on file prior to visit.   No current facility-administered medications on file prior to visit.    No Known Allergies  Review of Systems  Constitutional: Negative for fever and chills.  Gastrointestinal: Negative for abdominal pain.  Genitourinary: Positive for vaginal bleeding and menstrual problem. Negative for vaginal discharge and pelvic pain.   Objective:  BP 125/75 mmHg  Pulse 69  Temp(Src) 98.7 F (37.1 C) (Oral)  Resp 16  Ht 5\' 2"  (1.575 m)  Wt 127 lb 9.6 oz (57.879 kg)  BMI 23.33 kg/m2  SpO2 100%  Physical Exam  Constitutional: She is oriented to person, place, and time and well-developed, well-nourished, and in no distress.  HENT:  Head: Normocephalic and atraumatic.  Right Ear: Hearing and external ear normal.  Left Ear: Hearing and external ear normal.  Eyes: Conjunctivae are normal.  Neck: Normal range of motion.  Cardiovascular: Normal rate, regular rhythm and normal heart sounds.   No murmur heard. Pulmonary/Chest: Effort normal and breath sounds normal. She has no wheezes.  Genitourinary: Uterus normal, right adnexa normal, left adnexa normal and vulva normal. Brown and vaginal discharge  found.  Friable cervix with transformation zone present.  Neurological: She is alert and oriented to person, place, and time. Gait normal.  Skin: Skin is warm and dry.  Psychiatric: Mood, memory, affect and judgment normal.  Vitals reviewed.  Results for orders placed or performed in visit on 02/15/15  POCT Wet + KOH Prep  Result Value Ref Range   Yeast by KOH Absent Present, Absent   Yeast by wet prep Absent Present, Absent   WBC by wet prep Few None, Few, Too numerous to count   Clue Cells Wet Prep HPF POC Few (A) None, Too numerous to count   Trich by wet prep Absent Present, Absent   Bacteria Wet Prep HPF POC Many (A) None, Few, Too numerous to count   Epithelial Cells By [employer]ell [employer]ber[employer]Wet Pref (UMFC) Many (A) None, Few, Too numerous to count   RBC,UR,HPF,POC Few (A) None RBC/hpf  POCT urine pregnancy  Result Value Ref Range   Preg Test, Ur Negative Negative    Assessment and Plan :  Family planning, BCP (birth control pills) maintenance  Irregular intermenstrual bleeding - Plan: POCT Wet + KOH Prep, POCT urine pregnancy, GC/Chlamydia Probe Amp, levonorgestrel-ethinyl estradiol (AVIANE) 0.1-20 MG-MCG tablet   D/w pt different causes of irregular menses - we will check for STD but it is likely this is related to hormonal disregulation - we will change her OCP and see if that helps - we will also treat depending on her lab results.  We discussed other  forms of birth control because she is not wanting a pregnancy for at least 5 years - she is thinking about Nexplanon.  She understands and agrees with the above plan.  Benny Lennert PA-C  [facility] 02/15/2015 5:54 PM

## 2015-02-15 NOTE — Patient Instructions (Signed)
Etonogestrel implant What is this medicine? ETONOGESTREL (et oh noe JES trel) is a contraceptive (birth control) device. It is used to prevent pregnancy. It can be used for up to 3 years. This medicine may be used for other purposes; ask your health care provider or pharmacist if you have questions. What should I tell my health care provider before I take this medicine? They need to know if you have any of these conditions: -abnormal vaginal bleeding -blood vessel disease or blood clots -cancer of the breast, cervix, or liver -depression -diabetes -gallbladder disease -headaches -heart disease or recent heart attack -high blood pressure -high cholesterol -kidney disease -liver disease -renal disease -seizures -tobacco smoker -an unusual or allergic reaction to etonogestrel, other hormones, anesthetics or antiseptics, medicines, foods, dyes, or preservatives -pregnant or trying to get pregnant -breast-feeding How should I use this medicine? This device is inserted just under the skin on the inner side of your upper arm by a health care professional. Talk to your pediatrician regarding the use of this medicine in children. Special care may be needed. Overdosage: If you think you have taken too much of this medicine contact a poison control center or emergency room at once. NOTE: This medicine is only for you. Do not share this medicine with others. What if I miss a dose? This does not apply. What may interact with this medicine? Do not take this medicine with any of the following medications: -amprenavir -bosentan -fosamprenavir This medicine may also interact with the following medications: -barbiturate medicines for inducing sleep or treating seizures -certain medicines for fungal infections like ketoconazole and itraconazole -griseofulvin -medicines to treat seizures like carbamazepine, felbamate, oxcarbazepine, phenytoin,  topiramate -modafinil -phenylbutazone -rifampin -some medicines to treat HIV infection like atazanavir, indinavir, lopinavir, nelfinavir, tipranavir, ritonavir -St. John's wort This list may not describe all possible interactions. Give your health care provider a list of all the medicines, herbs, non-prescription drugs, or dietary supplements you use. Also tell them if you smoke, drink alcohol, or use illegal drugs. Some items may interact with your medicine. What should I watch for while using this medicine? This product does not protect you against HIV infection (AIDS) or other sexually transmitted diseases. You should be able to feel the implant by pressing your fingertips over the skin where it was inserted. Contact your doctor if you cannot feel the implant, and use a non-hormonal birth control method (such as condoms) until your doctor confirms that the implant is in place. If you feel that the implant may have broken or become bent while in your arm, contact your healthcare provider. What side effects may I notice from receiving this medicine? Side effects that you should report to your doctor or health care professional as soon as possible: -allergic reactions like skin rash, itching or hives, swelling of the face, lips, or tongue -breast lumps -changes in emotions or moods -depressed mood -heavy or prolonged menstrual bleeding -pain, irritation, swelling, or bruising at the insertion site -scar at site of insertion -signs of infection at the insertion site such as fever, and skin redness, pain or discharge -signs of pregnancy -signs and symptoms of a blood clot such as breathing problems; changes in vision; chest pain; severe, sudden headache; pain, swelling, warmth in the leg; trouble speaking; sudden numbness or weakness of the face, arm or leg -signs and symptoms of liver injury like dark yellow or brown urine; general ill feeling or flu-like symptoms; light-colored stools; loss of  appetite; nausea; right upper belly   pain; unusually weak or tired; yellowing of the eyes or skin -unusual vaginal bleeding, discharge -signs and symptoms of a stroke like changes in vision; confusion; trouble speaking or understanding; severe headaches; sudden numbness or weakness of the face, arm or leg; trouble walking; dizziness; loss of balance or coordination Side effects that usually do not require medical attention (Report these to your doctor or health care professional if they continue or are bothersome.): -acne -back pain -breast pain -changes in weight -dizziness -general ill feeling or flu-like symptoms -headache -irregular menstrual bleeding -nausea -sore throat -vaginal irritation or inflammation This list may not describe all possible side effects. Call your doctor for medical advice about side effects. You may report side effects to FDA at 1-800-FDA-1088. Where should I keep my medicine? This drug is given in a hospital or clinic and will not be stored at home. NOTE: This sheet is a summary. It may not cover all possible information. If you have questions about this medicine, talk to your doctor, pharmacist, or health care provider.    2016, Elsevier/Gold Standard. (2013-11-25 14:07:06)  

## 2015-02-16 LAB — GC/CHLAMYDIA PROBE AMP
CT PROBE, AMP APTIMA: NOT DETECTED
GC PROBE AMP APTIMA: NOT DETECTED

## 2015-03-19 ENCOUNTER — Telehealth: Payer: Self-pay

## 2015-03-19 NOTE — Telephone Encounter (Signed)
Pt is going out of the country and is wanting to get the [facility] medication she was given from [place] last time

## 2015-03-19 NOTE — Telephone Encounter (Signed)
Does pt need to RTC? 

## 2015-03-20 NOTE — Telephone Encounter (Signed)
It would be best for an OV because different malarial drugs are used based on location when the patient is traveling.

## 2015-03-20 NOTE — Telephone Encounter (Signed)
Correct

## 2015-03-20 NOTE — Telephone Encounter (Signed)
Attention:Jean Dickerson, I read pt your message. She says she was recently here 12/22 and she thought you could write one for her. She is going to [place] if that helps. Pharmacy:  [id number] - [place], [place] - [address] CB # [phone] (H)

## 2015-03-20 NOTE — Telephone Encounter (Signed)
RTC, on 12/22 this was not discussed. It does not count for what she is needing.

## 2015-03-20 NOTE — Telephone Encounter (Signed)
It is really better to do this with an OV due to the multiple issues that arise with travel medicine.  Is the patient going on a pleasure trip to [place] or is she going on a mission trip?  Where in [place]?  For [place] - per the CDC Typhoid is recommended Hep A is recommended The CDC says that malaria is not common and pregnant woman should be treated but she should be aware of Zika virus that is very common there

## 2015-04-26 ENCOUNTER — Ambulatory Visit (INDEPENDENT_AMBULATORY_CARE_PROVIDER_SITE_OTHER): Payer: BLUE CROSS/BLUE SHIELD | Admitting: Physician Assistant

## 2015-04-26 VITALS — BP 112/76 | HR 82 | Temp 97.9°F | Resp 16 | Ht 62.0 in | Wt 127.0 lb

## 2015-04-26 DIAGNOSIS — Z7184 Encounter for health counseling related to travel: Secondary | ICD-10-CM

## 2015-04-26 DIAGNOSIS — Z7189 Other specified counseling: Secondary | ICD-10-CM

## 2015-04-26 MED ORDER — ATOVAQUONE-PROGUANIL HCL 250-100 MG PO TABS: ORAL_TABLET | ORAL | Status: DC

## 2015-04-26 NOTE — Progress Notes (Signed)
   Jean Dickerson  MRN: [id number] DOB: Jul 02, 1985  Subjective:  Pt presents to clinic for travel vaccine information.  They are traveling to [place] on April 1st for a mission trip that will be outside.  Se had the oral typhoid in 2014.  She has had his Hep B and Hep A and MMR and Tdap is UTD.  Patient Active Problem List   Diagnosis Date Noted  . Acne 02/15/2015    Current Outpatient Prescriptions on File Prior to Visit  Medication Sig Dispense Refill  . ACZONE 7.5 % GEL   2  . DIFFERIN 0.3 % gel   2  . levonorgestrel-ethinyl estradiol (AVIANE) 0.1-20 MG-MCG tablet Take 1 tablet by mouth daily. 3 Package 4   No current facility-administered medications on file prior to visit.    No Known Allergies  Review of Systems Objective:  BP 112/76 mmHg  Pulse 82  Temp(Src) 97.9 F (36.6 C) (Oral)  Resp 16  Ht '5\' 2"'$  (1.575 m)  Wt 127 lb (57.607 kg)  BMI 23.22 kg/m2  SpO2 92%  LMP 04/25/2015 (Exact Date)  Physical Exam  Constitutional: She is oriented to person, place, and time and well-developed, well-nourished, and in no distress.  HENT:  Head: Normocephalic and atraumatic.  Right Ear: Hearing and external ear normal.  Left Ear: Hearing and external ear normal.  Eyes: Conjunctivae are normal.  Neck: Normal range of motion.  Pulmonary/Chest: Effort normal.  Neurological: She is alert and oriented to person, place, and time. Gait normal.  Skin: Skin is warm and dry.  Psychiatric: Mood, memory, affect and judgment normal.  Vitals reviewed.   Assessment and Plan :  Counseling about travel - Plan: atovaquone-proguanil (MALARONE) 250-100 MG TABS tablet   Discussed how to use medication.  Windell Hummingbird PA-C  [facility] 04/26/2015 9:16 AM

## 2015-05-10 ENCOUNTER — Ambulatory Visit: Payer: BLUE CROSS/BLUE SHIELD | Admitting: Physician Assistant

## 2015-08-21 ENCOUNTER — Telehealth: Payer: Self-pay

## 2015-08-21 NOTE — Telephone Encounter (Signed)
Per fax request, needs new rx for diflucan

## 2015-08-23 NOTE — Telephone Encounter (Signed)
If patient has yeast infection symptoms she needs to be evaluated.

## 2015-08-28 ENCOUNTER — Other Ambulatory Visit: Payer: Self-pay | Admitting: Family Medicine

## 2015-08-29 NOTE — Telephone Encounter (Signed)
Called pharm to decline RF w/message that pt needs eval first.

## 2015-10-15 ENCOUNTER — Telehealth: Payer: Self-pay | Admitting: Family Medicine

## 2015-10-15 NOTE — Telephone Encounter (Signed)
yes

## 2015-10-15 NOTE — Telephone Encounter (Signed)
° °  Pt said her husband Jean Dickerson spoke with Dr Caryl NeverBurchette and he said he would except both of them as new patients. Will he?

## 2015-10-15 NOTE — Telephone Encounter (Signed)
Okay to schedule as NP's?

## 2015-11-02 ENCOUNTER — Encounter: Payer: Self-pay | Admitting: Family Medicine

## 2015-11-02 ENCOUNTER — Ambulatory Visit (INDEPENDENT_AMBULATORY_CARE_PROVIDER_SITE_OTHER): Payer: Self-pay | Admitting: Family Medicine

## 2015-11-02 VITALS — BP 110/84 | HR 94 | Temp 98.1°F | Ht 61.5 in | Wt 135.0 lb

## 2015-11-02 DIAGNOSIS — R5383 Other fatigue: Secondary | ICD-10-CM

## 2015-11-02 DIAGNOSIS — N926 Irregular menstruation, unspecified: Secondary | ICD-10-CM

## 2015-11-02 DIAGNOSIS — G43009 Migraine without aura, not intractable, without status migrainosus: Secondary | ICD-10-CM

## 2015-11-02 LAB — CBC WITH DIFFERENTIAL/PLATELET
BASOS ABS: 0 10*3/uL (ref 0.0–0.1)
Basophils Relative: 0.4 % (ref 0.0–3.0)
EOS ABS: 0.2 10*3/uL (ref 0.0–0.7)
Eosinophils Relative: 1.9 % (ref 0.0–5.0)
HCT: 39.6 % (ref 36.0–46.0)
Hemoglobin: 13.6 g/dL (ref 12.0–15.0)
LYMPHS ABS: 2 10*3/uL (ref 0.7–4.0)
Lymphocytes Relative: 23.6 % (ref 12.0–46.0)
MCHC: 34.3 g/dL (ref 30.0–36.0)
MCV: 86.3 fl (ref 78.0–100.0)
MONO ABS: 0.8 10*3/uL (ref 0.1–1.0)
MONOS PCT: 9.3 % (ref 3.0–12.0)
NEUTROS PCT: 64.8 % (ref 43.0–77.0)
Neutro Abs: 5.4 10*3/uL (ref 1.4–7.7)
PLATELETS: 341 10*3/uL (ref 150.0–400.0)
RBC: 4.59 Mil/uL (ref 3.87–5.11)
RDW: 12.7 % (ref 11.5–15.5)
WBC: 8.3 10*3/uL (ref 4.0–10.5)

## 2015-11-02 LAB — TSH: TSH: 0.88 u[IU]/mL (ref 0.35–4.50)

## 2015-11-02 NOTE — Progress Notes (Signed)
Subjective:     Patient ID: Jean Dickerson, female   DOB: 1985-04-23, 30 y.o.   MRN: [id number]  HPI Patient seen to establish care. Generally healthy 30 year old female with no significant chronic past medical history. She is followed by dermatology and takes a couple of topical treatments for acne. Nonsmoker. History of reported migraine headaches without aura. Generally has about 1 migraine every 2 months. Previous wisdom teeth extraction. Otherwise, no surgery. Family history significant for several aunts with breast cancer. Paternal grandfather with lung cancer.  She states she was taking oral contraception until about 3 months ago. She has fairly regular menses but has noted a change in that that she tends to have about 10 days of some fairly light spotting and bleeding. Duration of 10 days somewhat longer than usual for her. Her periods are still occurring monthly and pretty much on time. She sometimes has a couple days of spotting followed by one or 2 days without and then recurrent spotting. She had recent home pregnancy test which was negative. She states that she and her husband are trying to get pregnant. No pelvic pain. No fevers or chills. Does have some general fatigue issues.  Last menstrual period was August 31.  Last Pap smear February 2016 normal. No history of HPV. No history of STD.  Past Medical History:  Diagnosis Date  . Allergy   . Anxiety   . Depression    Past Surgical History:  Procedure Laterality Date  . WISDOM TOOTH EXTRACTION      reports that she has never smoked. She has never used smokeless tobacco. She reports that she drinks about 2.4 oz of alcohol per week . She reports that she does not use drugs. family history includes Cancer in her maternal grandmother; Emphysema in her paternal grandmother; Hyperlipidemia in her mother; Hypertension in her father and mother; Liver disease in her maternal grandfather; Lung cancer in her paternal grandfather; Mental  illness in her father. No Known Allergies   Review of Systems  Constitutional: Positive for fatigue.  Eyes: Negative for visual disturbance.  Respiratory: Negative for cough, chest tightness, shortness of breath and wheezing.   Cardiovascular: Negative for chest pain, palpitations and leg swelling.  Genitourinary: Negative for hematuria, pelvic pain, vaginal discharge and vaginal pain.  Neurological: Negative for dizziness, seizures, syncope, weakness, light-headedness and headaches.       Objective:   Physical Exam  Constitutional: She is oriented to person, place, and time. She appears well-developed and well-nourished.  Neck: Neck supple. No thyromegaly present.  Cardiovascular: Normal rate and regular rhythm.  Exam reveals no gallop.   No murmur heard. Pulmonary/Chest: Effort normal and breath sounds normal. No respiratory distress. She has no wheezes. She has no rales.  Musculoskeletal: She exhibits no edema.  Neurological: She is alert and oriented to person, place, and time. No cranial nerve deficit.       Assessment:     #1 fatigue  #2 somewhat prolonged menstrual periods but occurring regularly each month after recent change in terms of getting off oral contraceptive pills    Plan:     -Check CBC and TSH -She is not interested in contraception to regulate periods as she and her husband are hoping to get pregnant soon.  Kristian CoveyBruce W Darnella Zeiter MD [facility]

## 2015-11-04 DIAGNOSIS — G43009 Migraine without aura, not intractable, without status migrainosus: Secondary | ICD-10-CM | POA: Insufficient documentation

## 2015-11-06 ENCOUNTER — Telehealth: Payer: Self-pay | Admitting: Family Medicine

## 2015-11-06 NOTE — Telephone Encounter (Signed)
Please see lab results annotations.

## 2015-11-06 NOTE — Telephone Encounter (Signed)
Pt would like blood work results °

## 2015-12-10 ENCOUNTER — Telehealth: Payer: Self-pay | Admitting: Family Medicine

## 2015-12-10 ENCOUNTER — Other Ambulatory Visit: Payer: Self-pay

## 2015-12-10 MED ORDER — FLUCONAZOLE 150 MG PO TABS: 150.0000 mg | ORAL_TABLET | Freq: Once | ORAL | 0 refills | Status: AC

## 2015-12-10 NOTE — Telephone Encounter (Signed)
Medication sent into the pharmacy for patient. 

## 2015-12-10 NOTE — Telephone Encounter (Signed)
Pt has a yeast inf and would like diflucan call into her [facility]. Pt has tried home emedies . Pt has not tried otc like [facility]

## 2015-12-10 NOTE — Telephone Encounter (Signed)
Okay to try fluconazole 150 mg 1 dose

## 2015-12-10 NOTE — Telephone Encounter (Signed)
Pt recently seen on 11/02/2015. Please advise if she needs an appt ?

## 2016-02-25 NOTE — L&D Delivery Note (Signed)
Delivery Note At 11:16 PM a viable and healthy female was delivered via Vaginal, Spontaneous Delivery (Presentation:vtx ; OA ).  APGAR: 9, 9; weight pending  .   Placenta status: spontaneous , intact not sent.  Cord: short  with the following complications: .  Cord pH: none  Anesthesia:epidural Episiotomy: None Lacerations: 2nd degree;Perineal;Sulcus Suture Repair: 3.0 chromic Est. Blood Loss (mL): 350  Mom to postpartum.  Baby to Couplet care / Skin to Skin.  Oswell Say A 09/18/2016, 11:49 PM

## 2016-02-28 LAB — OB RESULTS CONSOLE HIV ANTIBODY (ROUTINE TESTING): HIV: NONREACTIVE

## 2016-02-28 LAB — OB RESULTS CONSOLE ANTIBODY SCREEN: ANTIBODY SCREEN: NEGATIVE

## 2016-02-28 LAB — OB RESULTS CONSOLE HEPATITIS B SURFACE ANTIGEN: Hepatitis B Surface Ag: NEGATIVE

## 2016-02-28 LAB — OB RESULTS CONSOLE ABO/RH: RH TYPE: POSITIVE

## 2016-02-28 LAB — OB RESULTS CONSOLE RUBELLA ANTIBODY, IGM: Rubella: IMMUNE

## 2016-02-28 LAB — OB RESULTS CONSOLE RPR: RPR: NONREACTIVE

## 2016-03-13 LAB — OB RESULTS CONSOLE GC/CHLAMYDIA
Chlamydia: NEGATIVE
GC PROBE AMP, GENITAL: NEGATIVE

## 2016-04-07 ENCOUNTER — Telehealth: Payer: Self-pay | Admitting: Family Medicine

## 2016-04-07 NOTE — Telephone Encounter (Signed)
Pt is pregnant and due in aug 2018. Pt would like [facility] to accept her child and also would he be ok with delaying immunization for child

## 2016-04-17 NOTE — Telephone Encounter (Signed)
I am not taking any newborns at this time.  I would offer follow up with Dr Fabian SharpPanosh or Dr Selena BattenKim (if she is taking new babies) I would confirm first whether she has intentions not to immunize (based on comment at end of note).  If they are planing to delay or not get basic immunizations none of the providers in this clinic would recommend going that route.  I don't know of any local pediatricians who would support not getting basic immunizations.

## 2016-09-03 LAB — OB RESULTS CONSOLE GBS: STREP GROUP B AG: NEGATIVE

## 2016-09-16 ENCOUNTER — Inpatient Hospital Stay (HOSPITAL_COMMUNITY)
Admission: AD | Admit: 2016-09-16 | Discharge: 2016-09-16 | Disposition: A | Discharge status: Home / Self Care | Payer: Self-pay | Source: Ambulatory Visit | Attending: Obstetrics and Gynecology | Admitting: Obstetrics and Gynecology

## 2016-09-16 ENCOUNTER — Encounter (HOSPITAL_COMMUNITY): Payer: Self-pay

## 2016-09-16 DIAGNOSIS — O26893 Other specified pregnancy related conditions, third trimester: Secondary | ICD-10-CM | POA: Insufficient documentation

## 2016-09-16 DIAGNOSIS — O471 False labor at or after 37 completed weeks of gestation: Secondary | ICD-10-CM

## 2016-09-16 DIAGNOSIS — Z3A37 37 weeks gestation of pregnancy: Secondary | ICD-10-CM | POA: Insufficient documentation

## 2016-09-16 NOTE — MAU Note (Signed)
Pt reports contractions since midnight every 3 mins. Pt denies LOF or vaginal bleeding. Reports good fetal movement. States she was 1cm on last SVE.

## 2016-09-16 NOTE — Discharge Instructions (Signed)

## 2016-09-18 ENCOUNTER — Inpatient Hospital Stay (HOSPITAL_COMMUNITY): Payer: Self-pay | Admitting: Anesthesiology

## 2016-09-18 ENCOUNTER — Inpatient Hospital Stay (HOSPITAL_COMMUNITY)
Admission: AD | Admit: 2016-09-18 | Discharge: 2016-09-20 | DRG: 775 | Disposition: A | Discharge status: Home / Self Care | Payer: Self-pay | Source: Ambulatory Visit | Attending: Obstetrics and Gynecology | Admitting: Obstetrics and Gynecology

## 2016-09-18 ENCOUNTER — Encounter (HOSPITAL_COMMUNITY): Payer: Self-pay

## 2016-09-18 DIAGNOSIS — O403XX0 Polyhydramnios, third trimester, not applicable or unspecified: Principal | ICD-10-CM | POA: Diagnosis present

## 2016-09-18 DIAGNOSIS — L259 Unspecified contact dermatitis, unspecified cause: Secondary | ICD-10-CM | POA: Diagnosis present

## 2016-09-18 DIAGNOSIS — D649 Anemia, unspecified: Secondary | ICD-10-CM | POA: Diagnosis present

## 2016-09-18 DIAGNOSIS — O9902 Anemia complicating childbirth: Secondary | ICD-10-CM | POA: Diagnosis not present

## 2016-09-18 DIAGNOSIS — Z3A38 38 weeks gestation of pregnancy: Secondary | ICD-10-CM

## 2016-09-18 DIAGNOSIS — S60569A Insect bite (nonvenomous) of unspecified hand, initial encounter: Secondary | ICD-10-CM | POA: Diagnosis present

## 2016-09-18 LAB — CBC
HEMATOCRIT: 34.4 % — AB (ref 36.0–46.0)
HEMOGLOBIN: 11.6 g/dL — AB (ref 12.0–15.0)
MCH: 28.6 pg (ref 26.0–34.0)
MCHC: 33.7 g/dL (ref 30.0–36.0)
MCV: 84.9 fL (ref 78.0–100.0)
Platelets: 273 10*3/uL (ref 150–400)
RBC: 4.05 MIL/uL (ref 3.87–5.11)
RDW: 14.1 % (ref 11.5–15.5)
WBC: 10.1 10*3/uL (ref 4.0–10.5)

## 2016-09-18 LAB — TYPE AND SCREEN
ABO/RH(D): A POS
ANTIBODY SCREEN: NEGATIVE

## 2016-09-18 LAB — RPR: RPR: NONREACTIVE

## 2016-09-18 LAB — ABO/RH: ABO/RH(D): A POS

## 2016-09-18 MED ORDER — FENTANYL 2.5 MCG/ML BUPIVACAINE 1/10 % EPIDURAL INFUSION (WH - ANES)
INTRAMUSCULAR | Status: AC
  Filled 2016-09-18: qty 100

## 2016-09-18 MED ORDER — OXYTOCIN 40 UNITS IN LACTATED RINGERS INFUSION - SIMPLE MED
1.0000 m[IU]/min | INTRAVENOUS | Status: DC
  Administered 2016-09-18: 2 m[IU]/min via INTRAVENOUS

## 2016-09-18 MED ORDER — OXYTOCIN 40 UNITS IN LACTATED RINGERS INFUSION - SIMPLE MED
2.5000 [IU]/h | INTRAVENOUS | Status: DC
  Administered 2016-09-18: 2.5 [IU]/h via INTRAVENOUS
  Filled 2016-09-18: qty 1000

## 2016-09-18 MED ORDER — PHENYLEPHRINE 40 MCG/ML (10ML) SYRINGE FOR IV PUSH (FOR BLOOD PRESSURE SUPPORT)
80.0000 ug | PREFILLED_SYRINGE | INTRAVENOUS | Status: DC | PRN
  Filled 2016-09-18: qty 5

## 2016-09-18 MED ORDER — OXYCODONE-ACETAMINOPHEN 5-325 MG PO TABS: 1.0000 | ORAL_TABLET | ORAL | Status: DC | PRN

## 2016-09-18 MED ORDER — SOD CITRATE-CITRIC ACID 500-334 MG/5ML PO SOLN: 30.0000 mL | ORAL | Status: DC | PRN

## 2016-09-18 MED ORDER — TERBUTALINE SULFATE 1 MG/ML IJ SOLN
0.2500 mg | Freq: Once | INTRAMUSCULAR | Status: DC | PRN
  Filled 2016-09-18: qty 1

## 2016-09-18 MED ORDER — LACTATED RINGERS IV SOLN
INTRAVENOUS | Status: DC
  Administered 2016-09-18: 05:00:00 via INTRAVENOUS

## 2016-09-18 MED ORDER — DIPHENHYDRAMINE HCL 50 MG/ML IJ SOLN: 12.5000 mg | INTRAMUSCULAR | Status: DC | PRN

## 2016-09-18 MED ORDER — ACETAMINOPHEN 325 MG PO TABS: 650.0000 mg | ORAL_TABLET | ORAL | Status: DC | PRN

## 2016-09-18 MED ORDER — OXYCODONE-ACETAMINOPHEN 5-325 MG PO TABS: 2.0000 | ORAL_TABLET | ORAL | Status: DC | PRN

## 2016-09-18 MED ORDER — BUTORPHANOL TARTRATE 2 MG/ML IJ SOLN
2.0000 mg | INTRAMUSCULAR | Status: DC | PRN
  Administered 2016-09-18 (×2): 2 mg via INTRAVENOUS
  Filled 2016-09-18 (×3): qty 2

## 2016-09-18 MED ORDER — FENTANYL 2.5 MCG/ML BUPIVACAINE 1/10 % EPIDURAL INFUSION (WH - ANES)
14.0000 mL/h | INTRAMUSCULAR | Status: DC | PRN
  Administered 2016-09-18 (×2): 14 mL/h via EPIDURAL
  Filled 2016-09-18: qty 100

## 2016-09-18 MED ORDER — EPHEDRINE 5 MG/ML INJ
10.0000 mg | INTRAVENOUS | Status: DC | PRN
  Filled 2016-09-18: qty 2

## 2016-09-18 MED ORDER — OXYTOCIN 10 UNIT/ML IJ SOLN: 10.0000 [IU] | Freq: Once | INTRAMUSCULAR | Status: DC

## 2016-09-18 MED ORDER — LIDOCAINE HCL (PF) 1 % IJ SOLN
INTRAMUSCULAR | Status: DC | PRN
  Administered 2016-09-18 (×3): 4 mL via EPIDURAL

## 2016-09-18 MED ORDER — ONDANSETRON HCL 4 MG/2ML IJ SOLN: 4.0000 mg | Freq: Four times a day (QID) | INTRAMUSCULAR | Status: DC | PRN

## 2016-09-18 MED ORDER — PHENYLEPHRINE 40 MCG/ML (10ML) SYRINGE FOR IV PUSH (FOR BLOOD PRESSURE SUPPORT)
PREFILLED_SYRINGE | INTRAVENOUS | Status: AC
  Filled 2016-09-18: qty 10

## 2016-09-18 MED ORDER — OXYTOCIN BOLUS FROM INFUSION
500.0000 mL | Freq: Once | INTRAVENOUS | Status: AC
  Administered 2016-09-18: 500 mL via INTRAVENOUS

## 2016-09-18 MED ORDER — LACTATED RINGERS IV SOLN: 500.0000 mL | Freq: Once | INTRAVENOUS | Status: DC

## 2016-09-18 MED ORDER — LACTATED RINGERS IV SOLN: 500.0000 mL | INTRAVENOUS | Status: DC | PRN

## 2016-09-18 MED ORDER — EPHEDRINE 5 MG/ML INJ
10.0000 mg | INTRAVENOUS | Status: DC | PRN
Start: 2016-09-18 — End: 2016-09-19
  Filled 2016-09-18: qty 2

## 2016-09-18 MED ORDER — LIDOCAINE HCL (PF) 1 % IJ SOLN
30.0000 mL | INTRAMUSCULAR | Status: DC | PRN
  Filled 2016-09-18 (×2): qty 30

## 2016-09-18 NOTE — MAU Note (Signed)
Pt here with c/o contractions since about 9 pm. Denies bleeding or leaking of fluid. Reports positive fetal movement. Was 4 cm on last exam.

## 2016-09-18 NOTE — Anesthesia Preprocedure Evaluation (Signed)
Anesthesia Evaluation  Patient identified by MRN, date of birth, ID band Patient awake    Reviewed: Allergy & Precautions, H&P , NPO status , Patient's Chart, lab work & pertinent test results  History of Anesthesia Complications Negative for: history of anesthetic complications  Airway Mallampati: II  TM Distance: >3 FB Neck ROM: full    Dental no notable dental hx. (+) Teeth Intact   Pulmonary neg pulmonary ROS,    Pulmonary exam normal breath sounds clear to auscultation       Cardiovascular negative cardio ROS Normal cardiovascular exam Rhythm:regular Rate:Normal     Neuro/Psych PSYCHIATRIC DISORDERS negative neurological ROS     GI/Hepatic negative GI ROS, Neg liver ROS,   Endo/Other  negative endocrine ROS  Renal/GU negative Renal ROS     Musculoskeletal   Abdominal   Peds  Hematology  (+) anemia ,   Anesthesia Other Findings   Reproductive/Obstetrics (+) Pregnancy                             Anesthesia Physical Anesthesia Plan  ASA: II  Anesthesia Plan: Epidural   Post-op Pain Management:    Induction:   PONV Risk Score and Plan:   Airway Management Planned:   Additional Equipment:   Intra-op Plan:   Post-operative Plan:   Informed Consent: I have reviewed the patients History and Physical, chart, labs and discussed the procedure including the risks, benefits and alternatives for the proposed anesthesia with the patient or authorized representative who has indicated his/her understanding and acceptance.     Plan Discussed with:   Anesthesia Plan Comments:         Anesthesia Quick Evaluation

## 2016-09-18 NOTE — Progress Notes (Signed)
S: using nitrous oxide Breathing with ctx  O:  VE 5/90/-2 intact Tracing; baseline 120 (+) accel Ctx irreg  IMP: arrest of dilation due to inadequate ctx P) start pitocin. Pain med prn

## 2016-09-18 NOTE — Progress Notes (Signed)
S: rocking on ball  O: BP 133/84   Pulse 89   Temp 97.8 F (36.6 C) (Oral)   Resp 18   Ht 5\' 2"  (1.575 m)   Wt 84.8 kg (187 lb)   LMP 10/25/2015   SpO2 100%   BMI 34.20 kg/m  VE deferred Tracing: baseline 120 (+) accel 180  Reactive Ctx q1-4 mins  IMP: active phase Polyhydramnios IUP @ 38 weeks P) cont present mgmt

## 2016-09-18 NOTE — Anesthesia Pain Management Evaluation Note (Signed)
  CRNA Pain Management Visit Note  Patient: Jean FieldMarika Lasha Massey, 31 y.o., female  "Hello I am a member of the anesthesia team at [facility]. We have an anesthesia team available at all times to provide care throughout the hospital, including epidural management and anesthesia for C-section. I don't know your plan for the delivery whether it a natural birth, water birth, IV sedation, nitrous supplementation, doula or epidural, but we want to meet your pain goals."   1.Was your pain managed to your expectations on prior hospitalizations?   No prior hospitalizations  2.What is your expectation for pain management during this hospitalization?     Doula  3.How can we help you reach that goal? Remain available;pt informed of epidural option but expressed she wasn't interested in this option and didn't want to discuss pain management  Record the patient's initial score and the patient's pain goal.   Pain: Patient sleeping - unable to assess  Pain Goal: Patient sleeping - unable to assess [facility] wants you to be able to say your pain was always managed very well.  Edison PaceWILKERSON,Kit Mollett 09/18/2016

## 2016-09-18 NOTE — H&P (Signed)
Caren MacadamMarika Berdine DanceLasha Statz is a 31 y.o. female presenting for admission due to active labor. (-) GBS. Intact membrane OB History    Gravida Para Term Preterm AB Living   1             SAB TAB Ectopic Multiple Live Births                 Past Medical History:  Diagnosis Date  . Allergy   . Anxiety   . Depression    Past Surgical History:  Procedure Laterality Date  . WISDOM TOOTH EXTRACTION     Family History: family history includes Cancer in her maternal grandmother; Emphysema in her paternal grandmother; Hyperlipidemia in her mother; Hypertension in her father and mother; Liver disease in her maternal grandfather; Lung cancer in her paternal grandfather; Mental illness in her father. Social History:  reports that she has never smoked. She has never used smokeless tobacco. She reports that she drinks about 2.4 oz of alcohol per week . She reports that she does not use drugs.     Maternal Diabetes: No Genetic Screening: Normal Maternal Ultrasounds/Referrals: Normal Fetal Ultrasounds or other Referrals:  None Maternal Substance Abuse:  No Significant Maternal Medications:  None Significant Maternal Lab Results:  Lab values include: Group B Strep negative Other Comments polyhydramnios  Review of Systems  All other systems reviewed and are negative.  History Dilation: 6 Effacement (%): 80 Station: -2 Exam by:: V Rogers RN  Blood pressure (!) 144/79, pulse 83, temperature 98.1 F (36.7 C), temperature source Oral, resp. rate 20, height 5\' 2"  (1.575 m), weight 84.8 kg (187 lb), last menstrual period 10/25/2015, SpO2 100 %. Exam Physical Exam  Constitutional: She appears well-developed and well-nourished.  HENT:  Head: Atraumatic.  Eyes: EOM are normal.  Neck: Neck supple.  Cardiovascular: Regular rhythm.   Respiratory: Breath sounds normal.  GI: Soft.  Musculoskeletal: Normal range of motion.  Neurological: She is alert.  Skin: Skin is warm and dry.    Prenatal  labs: ABO, Rh:  A positive Antibody:  neg Rubella:  Immune RPR:   NR HBsAg:  neg  HIV:   neg GBS:   neg  Assessment/Plan: Active labor IUP @ 38 weeks Polyhydramnios P) admit routine labs. Analgesic prn  Chantilly Linskey A 09/18/2016, 4:39 AM

## 2016-09-18 NOTE — Progress Notes (Signed)
S; comfortable after Epidural SROM clear fluid  O: Pitocin VE  7-8./90/-1 ROP  tracing cat 1 Ctx q 2-4 mins  IMP: protracted active phase P0 right exaggerated sims. Cont increase pitocin

## 2016-09-18 NOTE — Anesthesia Procedure Notes (Signed)
Epidural Patient location during procedure: OB Start time: 09/18/2016 4:14 PM  Staffing Anesthesiologist: Karna ChristmasELLENDER, Reed Dady P Performed: anesthesiologist   Preanesthetic Checklist Completed: patient identified, site marked, pre-op evaluation, timeout performed, IV checked, risks and benefits discussed and monitors and equipment checked  Epidural Patient position: sitting Prep: DuraPrep Patient monitoring: heart rate, cardiac monitor, continuous pulse ox and blood pressure Approach: midline Location: L4-L5 Injection technique: LOR air  Needle:  Needle type: Tuohy  Needle gauge: 17 G Needle length: 9 cm Needle insertion depth: 5 cm Catheter type: closed end flexible Catheter size: 19 Gauge Catheter at skin depth: 10 cm Test dose: negative and Other  Assessment Events: blood not aspirated, injection not painful, no injection resistance and negative IV test  Additional Notes Informed consent obtained prior to proceeding including risk of failure, 1% risk of PDPH, risk of minor discomfort and bruising.  Discussed rare but serious complications including epidural abscess, permanent nerve injury, epidural hematoma.  Discussed alternatives to epidural analgesia and patient desires to proceed.  Timeout performed pre-procedure verifying patient name, procedure, and platelet count.  Patient tolerated procedure well. Reason for block:procedure for pain

## 2016-09-19 ENCOUNTER — Encounter (HOSPITAL_COMMUNITY): Payer: Self-pay

## 2016-09-19 DIAGNOSIS — O9902 Anemia complicating childbirth: Secondary | ICD-10-CM | POA: Diagnosis not present

## 2016-09-19 LAB — BIRTH TISSUE RECOVERY COLLECTION (PLACENTA DONATION)

## 2016-09-19 LAB — CBC
HEMATOCRIT: 25.2 % — AB (ref 36.0–46.0)
HEMOGLOBIN: 8.6 g/dL — AB (ref 12.0–15.0)
MCH: 29.1 pg (ref 26.0–34.0)
MCHC: 34.1 g/dL (ref 30.0–36.0)
MCV: 85.1 fL (ref 78.0–100.0)
Platelets: 210 10*3/uL (ref 150–400)
RBC: 2.96 MIL/uL — ABNORMAL LOW (ref 3.87–5.11)
RDW: 14.5 % (ref 11.5–15.5)
WBC: 13.6 10*3/uL — ABNORMAL HIGH (ref 4.0–10.5)

## 2016-09-19 MED ORDER — COCONUT OIL OIL
1.0000 "application " | TOPICAL_OIL | Status: DC | PRN
  Administered 2016-09-19: 1 via TOPICAL
  Filled 2016-09-19: qty 120

## 2016-09-19 MED ORDER — SENNOSIDES-DOCUSATE SODIUM 8.6-50 MG PO TABS
2.0000 | ORAL_TABLET | ORAL | Status: DC
  Administered 2016-09-19 – 2016-09-20 (×2): 2 via ORAL
  Filled 2016-09-19 (×2): qty 2

## 2016-09-19 MED ORDER — IBUPROFEN 600 MG PO TABS
600.0000 mg | ORAL_TABLET | Freq: Four times a day (QID) | ORAL | Status: DC
  Administered 2016-09-19 – 2016-09-20 (×7): 600 mg via ORAL
  Filled 2016-09-19 (×7): qty 1

## 2016-09-19 MED ORDER — ONDANSETRON HCL 4 MG/2ML IJ SOLN: 4.0000 mg | INTRAMUSCULAR | Status: DC | PRN

## 2016-09-19 MED ORDER — DIPHENHYDRAMINE HCL 25 MG PO CAPS
25.0000 mg | ORAL_CAPSULE | Freq: Four times a day (QID) | ORAL | Status: DC | PRN
  Administered 2016-09-20 (×2): 25 mg via ORAL
  Filled 2016-09-19 (×2): qty 1

## 2016-09-19 MED ORDER — POLYSACCHARIDE IRON COMPLEX 150 MG PO CAPS
150.0000 mg | ORAL_CAPSULE | Freq: Every day | ORAL | Status: DC
  Administered 2016-09-19 – 2016-09-20 (×2): 150 mg via ORAL
  Filled 2016-09-19 (×2): qty 1

## 2016-09-19 MED ORDER — MAGNESIUM OXIDE 400 (241.3 MG) MG PO TABS
400.0000 mg | ORAL_TABLET | Freq: Every day | ORAL | Status: DC
  Administered 2016-09-19 – 2016-09-20 (×2): 400 mg via ORAL
  Filled 2016-09-19 (×3): qty 1

## 2016-09-19 MED ORDER — SIMETHICONE 80 MG PO CHEW: 80.0000 mg | CHEWABLE_TABLET | ORAL | Status: DC | PRN

## 2016-09-19 MED ORDER — DIBUCAINE 1 % RE OINT: 1.0000 "application " | TOPICAL_OINTMENT | RECTAL | Status: DC | PRN

## 2016-09-19 MED ORDER — ZOLPIDEM TARTRATE 5 MG PO TABS: 5.0000 mg | ORAL_TABLET | Freq: Every evening | ORAL | Status: DC | PRN

## 2016-09-19 MED ORDER — ONDANSETRON HCL 4 MG PO TABS: 4.0000 mg | ORAL_TABLET | ORAL | Status: DC | PRN

## 2016-09-19 MED ORDER — BENZOCAINE-MENTHOL 20-0.5 % EX AERO
1.0000 "application " | INHALATION_SPRAY | CUTANEOUS | Status: DC | PRN
  Administered 2016-09-19 – 2016-09-20 (×2): 1 via TOPICAL
  Filled 2016-09-19 (×2): qty 56

## 2016-09-19 MED ORDER — FERROUS SULFATE 325 (65 FE) MG PO TABS: 325.0000 mg | ORAL_TABLET | Freq: Two times a day (BID) | ORAL | Status: DC

## 2016-09-19 MED ORDER — ACETAMINOPHEN 325 MG PO TABS
650.0000 mg | ORAL_TABLET | ORAL | Status: DC | PRN
  Administered 2016-09-19 (×2): 650 mg via ORAL
  Filled 2016-09-19 (×2): qty 2

## 2016-09-19 MED ORDER — PRENATAL MULTIVITAMIN CH
1.0000 | ORAL_TABLET | Freq: Every day | ORAL | Status: DC
  Administered 2016-09-19 – 2016-09-20 (×2): 1 via ORAL
  Filled 2016-09-19 (×2): qty 1

## 2016-09-19 MED ORDER — WITCH HAZEL-GLYCERIN EX PADS: 1.0000 "application " | MEDICATED_PAD | CUTANEOUS | Status: DC | PRN

## 2016-09-19 NOTE — Progress Notes (Signed)
PPD # 1 SVD Information for the patient's newborn:  Jean Dickerson, Jean Dickerson [[id number]][[id number]]  female    breast feeding   Baby name: Brantley Stagesther Rose  S:  Reports feeling sore but well.             Tolerating po/ No nausea or vomiting             Bleeding is moderate             Pain controlled with acetaminophen and ibuprofen (OTC)             Up ad lib / ambulatory / voiding without difficulties        O:  A & O x 3, in no apparent distress              VS:  Vitals:   09/19/16 0001 09/19/16 0130 09/19/16 0230 09/19/16 0600  BP: (!) 114/54 137/72 126/72 134/81  Pulse: (!) 109 92 81 76  Resp:  18 18 18   Temp:  98.9 F (37.2 C) 98.4 F (36.9 C) 97.6 F (36.4 C)  TempSrc:  Oral Oral Oral  SpO2:      Weight:      Height:        LABS:  Recent Labs  09/18/16 0510 09/19/16 0458  WBC 10.1 13.6*  HGB 11.6* 8.6*  HCT 34.4* 25.2*  PLT 273 210    Blood type: --/--/A POS, A POS (07/26 0510)  Rubella: Immune (01/04 0000)   I&O: I/O last 3 completed shifts: In: -  Out: 2000 [Urine:1650; Blood:350]          No intake/output data recorded.  Lungs: Clear and unlabored  Heart: regular rate and rhythm / no murmurs  Abdomen: soft, non-tender, non-distended             Fundus: firm, non-tender, U-2  Perineum: repair intact, mild edema, ice pack on  Lochia: small  Extremities: trace pedal edema, no calf pain or tenderness    A/P: PPD # 1 30 y.o., G1P1001   Principal Problem:   Postpartum care following vaginal delivery 7/26 Active Problems:   Maternal anemia, with delivery   Second-degree perineal laceration, with delivery   Doing well - stable status  Oral Fe and Mag ox initiated    Routine post partum orders  Anticipate discharge tomorrow    Neta Mendsaniela C Jamaia Brum, MSN, CNM 09/19/2016, 9:25 AM

## 2016-09-19 NOTE — Anesthesia Postprocedure Evaluation (Signed)
Anesthesia Post Note  Patient: Jean Dickerson  Procedure(s) Performed: * No procedures listed *     Patient location during evaluation: Mother Baby Anesthesia Type: Epidural Level of consciousness: awake and alert and oriented Pain management: pain level controlled Vital Signs Assessment: post-procedure vital signs reviewed and stable Respiratory status: spontaneous breathing and nonlabored ventilation Cardiovascular status: stable Postop Assessment: no headache, no signs of nausea or vomiting, no backache, adequate PO intake, epidural receding and patient able to bend at knees Anesthetic complications: no    Last Vitals:  Vitals:   09/19/16 0230 09/19/16 0600  BP: 126/72 134/81  Pulse: 81 76  Resp: 18 18  Temp: 36.9 C 36.4 C    Last Pain:  Vitals:   09/19/16 0717  TempSrc:   PainSc: 2    Pain Goal: Patients Stated Pain Goal: 2 (09/19/16 0717)               Laban EmperorMalinova,Anari Evitt Hristova

## 2016-09-19 NOTE — Progress Notes (Signed)
MOB was referred for history of depression/anxiety.  Referral is screened out by Clinical Social Worker because none of the following criteria appear to apply and there are no reports impacting the pregnancy or her transition to the postpartum period.  CSW does not deem it clinically necessary to further investigate at this time.   -History of anxiety/depression during this pregnancy, or of post-partum depression - Diagnosis of anxiety and/or depression within last 3 years (MOB PMH of Anx/Dep prior to 2014)  - History of depression due to pregnancy loss/loss of child or -MOB's symptoms are currently being treated with medication and/or therapy.  CSW completed chart review to assess MOB's hx of anx/dep. This Clinical research associatewriter saw no mention of anx/dep [facility] and/or current nursing/clinical team notes since admission. CSW saw sticky note in baby's chart noting questionable alcohol use. MOB's P[facility] records do no indicate any current or hx of substance abuse. Please contact the Clinical Social Worker if needs arise or upon MOB request.    Bubba Camphanelle Providencia Hottenstein, MSW, LCSW-A Clinical Social Worker  [facility]  Office: [phone]

## 2016-09-19 NOTE — Lactation Note (Signed)
This note was copied from a baby's chart. Lactation Consultation Note  Patient Name: Girl Cathren LaineMarika Kunkler ZOXWR'UToday's Date: 09/19/2016 Reason for consult: Initial assessment  Baby is 14-1/2 hours old and has been to the breast x 2 , mom reports the latches have been on and off.  Mom brought Lansinoh nipples yields from home and was recommended by her doctor to buy them due to flat  Nipples. Leeann MustJamillia Walker( friend of patient, private LC and doula ) plans to follow her and assist.  Short visit with her family to see the baby. Jamillia requested the Medela nipple shields due to them accommodating  The tissue better. LC agreed from her experience.  LC came back to see mom, baby at 1350 when company had left.  1st LC assessed moms breast  tissue with her permission, noted the areolas to be semi compressible,  Showed mom how to hand express without EBM yield, sized mom for #20 NS, and after several attempts baby  Latched with depth , few sucks and fell asleep. LC assisted mom to lay the baby on her chest for STS.  Mom aware her breast tissue will need some work for baby to latch. Also shells with a bra when the baby isn't STS.  Nipple Shield , and extra pumping - pre-pumping and post pumping.  LC instructed mom on the use hand pump , #24 flange appeared to large at 1st, but actually made the areola more compressible and the Nipple Shield fit better. #20 NS.  Steps for latching - breast massage , hand express, pre - pump with hand pump, and apply nipple Shield, feed the baby,  Post pump for 10 -15 mins , save milk and when available instill in the top of the Nipple Shield as an appetizer so the baby  Has incentive to feed/ extra calories.  LC asked MBU RN to set up the DEBP for post pumping.- save milk.      Maternal Data Has patient been taught Hand Expression?: Yes Does the patient have breastfeeding experience prior to this delivery?: No  Feeding Feeding Type: Breast Fed (baby sleepy )  LATCH  Score/Interventions - Attempted  Latch: Repeated attempts needed to sustain latch, nipple held in mouth throughout feeding, stimulation needed to elicit sucking reflex. (right breast / football ) Intervention(s): Skin to skin;Teach feeding cues;Waking techniques Intervention(s): Adjust position;Assist with latch;Breast massage;Breast compression  Audible Swallowing: None  Type of Nipple: Flat (improved with pre-pumping and NS for latch )  Comfort (Breast/Nipple): Soft / non-tender     Hold (Positioning): Assistance needed to correctly position infant at breast and maintain latch. Intervention(s): Breastfeeding basics reviewed;Support Pillows;Position options;Skin to skin  LATCH Score: 5  Lactation Tools Discussed/Used Tools: Shells;Nipple Dorris CarnesShields;Pump Nipple shield size: 20 (LC sized mom and #20 NS fot well after pre-pumping ) Shell Type: Inverted Breast pump type: Manual (LC instructed mom on the use hand pump ) Pump Review: Setup, frequency, and cleaning Initiated by:: MAI  Date initiated:: 09/19/16   Consult Status Consult Status: Follow-up Date: 09/19/16 Follow-up type: In-patient    Matilde SprangMargaret Ann Elaya Droege 09/19/2016, 2:27 PM

## 2016-09-19 NOTE — Lactation Note (Signed)
This note was copied from a baby's chart. Lactation Consultation Note  Patient Name: Jean Dickerson ZOXWR'UToday's Date: 09/19/2016 Reason for consult: Follow-up assessment;Difficult latch  2nd visit baby more awake, placed in laid back . Used #20 NS and baby latched with depth  1-2 swallows. And fed 5 mins and released.  Per mom the latch well better and it was the longest the baby stayed on .  MBU RN is going to set up for a DEBP .    Maternal Data Has patient been taught Hand Expression?: Yes Does the patient have breastfeeding experience prior to this delivery?: No  Feeding Feeding Type: Breast Fed Length of feed: 5 min  LATCH Score/Interventions Latch: Grasps breast easily, tongue down, lips flanged, rhythmical sucking. Intervention(s): Skin to skin;Teach feeding cues;Waking techniques Intervention(s): Adjust position;Assist with latch;Breast massage;Breast compression  Audible Swallowing: A few with stimulation  Type of Nipple: Flat (NS #20 )  Comfort (Breast/Nipple): Soft / non-tender     Hold (Positioning): Full assist, staff holds infant at breast Intervention(s): Breastfeeding basics reviewed;Support Pillows;Position options;Skin to skin  LATCH Score: 6  Lactation Tools Discussed/Used Tools: Nipple Shields Nipple shield size: 20 Shell Type: Inverted Breast pump type: Double-Electric Breast Pump (MBU  RN will set up the DEBP ) Pump Review: Setup, frequency, and cleaning Initiated by:: MAI  Date initiated:: 09/19/16   Consult Status Consult Status: Follow-up Date: 09/20/16 Follow-up type: In-patient    Jean Dickerson 09/19/2016, 3:25 PM

## 2016-09-20 MED ORDER — POLYSACCHARIDE IRON COMPLEX 150 MG PO CAPS: 150.0000 mg | ORAL_CAPSULE | Freq: Every day | ORAL | 3 refills | Status: DC

## 2016-09-20 MED ORDER — BENZOCAINE-MENTHOL 20-0.5 % EX AERO
1.0000 | INHALATION_SPRAY | CUTANEOUS | Status: DC | PRN
Start: 2016-09-20 — End: 2017-04-24

## 2016-09-20 MED ORDER — MAGNESIUM OXIDE 400 (241.3 MG) MG PO TABS
400.0000 mg | ORAL_TABLET | Freq: Every day | ORAL | Status: DC
Start: 2016-09-20 — End: 2017-04-24

## 2016-09-20 MED ORDER — ACETAMINOPHEN 325 MG PO TABS: 650.0000 mg | ORAL_TABLET | ORAL | Status: DC | PRN

## 2016-09-20 MED ORDER — IBUPROFEN 600 MG PO TABS: 600.0000 mg | ORAL_TABLET | Freq: Four times a day (QID) | ORAL | 0 refills | Status: DC

## 2016-09-20 MED ORDER — COCONUT OIL OIL: 1.0000 "application " | TOPICAL_OIL | 0 refills | Status: DC | PRN

## 2016-09-20 NOTE — Lactation Note (Signed)
This note was copied from a baby's chart. Lactation Consultation Note  Patient Name: Jean Dickerson ZOXWR'UToday's Date: 09/20/2016 Reason for consult: Follow-up assessment  Baby 34 hours old. Mom reports some nipple soreness while using NS. Offered to assist mom with latching baby, but mom declined stating that she has a private LC that she will be seeing. Mom reports that she has not been using DEBP. Discussed the need to continue post-pumping while using NS and mom states that she understands. Mom reports that she has a DEBP at home. Mom requested comfort gels because a friend of hers told her about them. Mom reports that her nipples are sore, but no breakdown. Mom given comfort gels with review. Mom aware of OP/BFSG and LC phone line assistance after D/C.   Maternal Data    Feeding Feeding Type: Breast Fed Length of feed: 30 min  LATCH Score/Interventions                      Lactation Tools Discussed/Used Tools: Pump;Nipple Shields;Comfort gels Nipple shield size: 20 Shell Type: Inverted Breast pump type: Double-Electric Breast Pump   Consult Status Consult Status: PRN    Sherlyn HayJennifer D Nakesha Ebrahim 09/20/2016, 9:31 AM

## 2016-09-20 NOTE — Progress Notes (Signed)
Post Partum Day #2           Information for the patient's newborn:  Doy HutchingLindsay, Girl Harlowe [[id number]][[id number]]  female  Baby name: Kathrine HaddockEsther Rose Feeding: breast, [facility] and [facility] for [facility]  Subjective: No HA, SOB, CP, F/C, breast symptoms. Pain improving. Normal vaginal bleeding, no clots.    Notes rash on arms and feet, since yesterday, used Benadryl last night with moderate relief.    Objective:  VS:  Vitals:   09/19/16 0600 09/19/16 1047 09/19/16 1843 09/20/16 0537  BP: 134/81 120/84 129/79 131/86  Pulse: 76 92 91 71  Resp: 18 16 18 18   Temp: 97.6 F (36.4 C) 98.3 F (36.8 C) 98.4 F (36.9 C) 98.1 F (36.7 C)  TempSrc: Oral Oral Oral Oral  SpO2:   100%   Weight:      Height:        No intake or output data in the 24 hours ending 09/20/16 0925     Recent Labs  09/18/16 0510 09/19/16 0458  WBC 10.1 13.6*  HGB 11.6* 8.6*  HCT 34.4* 25.2*  PLT 273 210    Blood type: --/--/A POS, A POS (07/26 0510) Rubella: Immune (01/04 0000)    Physical Exam:  General: alert, cooperative and no distress Uterine Fundus: firm Lochia: appropriate Perineum: repair intact, edema mild DVT Evaluation: No cords or calf tenderness. No significant calf/ankle edema. Skin: Diffuse macular rash, insular over hands, arms and feet   Assessment/Plan: PPD # 2 / 31 y.o., G1P1001 S/P: NSVB   Principal Problem:   Postpartum care following vaginal delivery 7/26 Active Problems:   Maternal anemia, with delivery  - continue oral Fe and Mag ox x 4-6 wks PP   Second-degree perineal laceration, with delivery Contact dermatitis vs insect bites  - use topical benadryl, call if rash worsens    normal postpartum exam  Continue current postpartum care             DC home today w/ instructions  [facility] and PRN   LOS: 2 days   Neta Mendsaniela C Kit Brubacher, CNM, MSN 09/20/2016, 9:25 AM

## 2016-09-20 NOTE — Discharge Summary (Signed)
Obstetric Discharge Summary Reason for Admission: onset of labor Prenatal Procedures: ultrasound Intrapartum Procedures: spontaneous vaginal delivery and epidural Postpartum Procedures: none Complications-Operative and Postpartum: 2nd degree perineal laceration Hemoglobin  Date Value Ref Range Status  09/19/2016 8.6 (L) 12.0 - 15.0 g/dL Final    Comment:    DELTA CHECK NOTED REPEATED TO VERIFY    HCT  Date Value Ref Range Status  09/19/2016 25.2 (L) 36.0 - 46.0 % Final    Physical Exam:  General: alert, cooperative and no distress Lochia: appropriate Uterine Fundus: firm Incision: healing well DVT Evaluation: No significant calf/ankle edema.  Discharge Diagnoses: Term Pregnancy-delivered and anemai  Discharge Information: Date: 09/20/2016 Activity: pelvic rest Diet: routine Medications:  Allergies as of 09/20/2016   No Known Allergies     Medication List    TAKE these medications   acetaminophen 325 MG tablet Commonly known as:  TYLENOL Take 2 tablets (650 mg total) by mouth every 4 (four) hours as needed (for pain scale < 4).   benzocaine-Menthol 20-0.5 % Aero Commonly known as:  DERMOPLAST Apply 1 application topically as needed for irritation (perineal discomfort).   coconut oil Oil Apply 1 application topically as needed.   Cranberry 400 MG Caps Take 800 mg by mouth daily.   ibuprofen 600 MG tablet Commonly known as:  ADVIL,MOTRIN Take 1 tablet (600 mg total) by mouth every 6 (six) hours.   iron polysaccharides 150 MG capsule Commonly known as:  NIFEREX Take 1 capsule (150 mg total) by mouth daily.   magnesium oxide 400 (241.3 Mg) MG tablet Commonly known as:  MAG-OX Take 1 tablet (400 mg total) by mouth daily.   prenatal multivitamin Tabs tablet Take 1 tablet by mouth daily at 12 noon.      Condition: stable Instructions: refer to practice specific booklet Discharge to: home Follow-up Information    Maxie Betterousins, Sheronette, MD. Schedule an  appointment as soon as possible for a visit in 6 week(s).   Specialty:  Obstetrics and Gynecology Contact information: [address] [place] [id number] [phone]           Newborn Data: Live born female Kathrine Haddocksther Rose Birth Weight: 6 lb 7.5 oz (2934 g) APGAR: 9, 9  Home with mother.  Neta MendsDaniela C Paul, CNM 09/20/2016, 9:40 AM

## 2017-01-02 ENCOUNTER — Other Ambulatory Visit: Payer: Self-pay | Admitting: Surgery

## 2017-01-02 DIAGNOSIS — N649 Disorder of breast, unspecified: Secondary | ICD-10-CM

## 2017-01-09 ENCOUNTER — Ambulatory Visit
Admission: RE | Admit: 2017-01-09 | Discharge: 2017-01-09 | Disposition: A | Discharge status: Home / Self Care | Payer: PRIVATE HEALTH INSURANCE | Source: Ambulatory Visit | Attending: Surgery | Admitting: Surgery

## 2017-01-09 ENCOUNTER — Other Ambulatory Visit: Payer: Self-pay | Admitting: Surgery

## 2017-01-09 DIAGNOSIS — N649 Disorder of breast, unspecified: Secondary | ICD-10-CM

## 2017-02-04 ENCOUNTER — Other Ambulatory Visit: Payer: Self-pay | Admitting: Obstetrics and Gynecology

## 2017-02-13 ENCOUNTER — Other Ambulatory Visit: Payer: Self-pay | Admitting: Obstetrics and Gynecology

## 2017-02-13 ENCOUNTER — Other Ambulatory Visit: Payer: Self-pay | Admitting: Surgery

## 2017-02-13 DIAGNOSIS — N63 Unspecified lump in unspecified breast: Secondary | ICD-10-CM

## 2017-02-18 ENCOUNTER — Other Ambulatory Visit: Payer: Self-pay | Admitting: Obstetrics and Gynecology

## 2017-02-18 ENCOUNTER — Ambulatory Visit
Admission: RE | Admit: 2017-02-18 | Discharge: 2017-02-18 | Disposition: A | Discharge status: Home / Self Care | Payer: PRIVATE HEALTH INSURANCE | Source: Ambulatory Visit | Attending: Obstetrics and Gynecology | Admitting: Obstetrics and Gynecology

## 2017-02-18 DIAGNOSIS — N63 Unspecified lump in unspecified breast: Secondary | ICD-10-CM

## 2017-02-20 ENCOUNTER — Ambulatory Visit
Admission: RE | Admit: 2017-02-20 | Discharge: 2017-02-20 | Disposition: A | Discharge status: Home / Self Care | Payer: No Typology Code available for payment source | Source: Ambulatory Visit | Attending: Obstetrics and Gynecology | Admitting: Obstetrics and Gynecology

## 2017-02-20 ENCOUNTER — Other Ambulatory Visit: Payer: No Typology Code available for payment source

## 2017-02-20 ENCOUNTER — Other Ambulatory Visit: Payer: Self-pay | Admitting: Obstetrics and Gynecology

## 2017-02-20 ENCOUNTER — Inpatient Hospital Stay: Admission: RE | Admit: 2017-02-20 | Payer: No Typology Code available for payment source | Source: Ambulatory Visit

## 2017-02-20 DIAGNOSIS — N63 Unspecified lump in unspecified breast: Secondary | ICD-10-CM

## 2017-02-25 ENCOUNTER — Other Ambulatory Visit: Payer: No Typology Code available for payment source

## 2017-03-02 ENCOUNTER — Other Ambulatory Visit: Payer: Self-pay | Admitting: Obstetrics and Gynecology

## 2017-03-02 ENCOUNTER — Ambulatory Visit
Admission: RE | Admit: 2017-03-02 | Discharge: 2017-03-02 | Disposition: A | Discharge status: Home / Self Care | Payer: No Typology Code available for payment source | Source: Ambulatory Visit | Attending: Obstetrics and Gynecology | Admitting: Obstetrics and Gynecology

## 2017-03-02 DIAGNOSIS — N63 Unspecified lump in unspecified breast: Secondary | ICD-10-CM

## 2017-03-30 ENCOUNTER — Other Ambulatory Visit: Payer: Self-pay | Admitting: Obstetrics and Gynecology

## 2017-03-30 ENCOUNTER — Ambulatory Visit
Admission: RE | Admit: 2017-03-30 | Discharge: 2017-03-30 | Disposition: A | Discharge status: Home / Self Care | Payer: No Typology Code available for payment source | Source: Ambulatory Visit | Attending: Obstetrics and Gynecology | Admitting: Obstetrics and Gynecology

## 2017-03-30 ENCOUNTER — Inpatient Hospital Stay: Admission: RE | Admit: 2017-03-30 | Payer: No Typology Code available for payment source | Source: Ambulatory Visit

## 2017-03-30 DIAGNOSIS — N63 Unspecified lump in unspecified breast: Secondary | ICD-10-CM

## 2017-03-30 DIAGNOSIS — N6310 Unspecified lump in the right breast, unspecified quadrant: Secondary | ICD-10-CM

## 2017-04-17 ENCOUNTER — Telehealth: Payer: Self-pay | Admitting: Family Medicine

## 2017-04-17 ENCOUNTER — Encounter: Payer: No Typology Code available for payment source | Admitting: Family Medicine

## 2017-04-17 NOTE — Telephone Encounter (Signed)
Copied from CRM #[id number]. Topic: Quick Communication - See Telephone Encounter >> Apr 17, 2017  2:51 PM Waymon AmatoBurton, Donna F wrote: Pt has an appt on 04/24/17 amd wamts to make sure that she will have labs -her husband had the same appt today and did not get labs  Best [phone]

## 2017-04-20 NOTE — Telephone Encounter (Signed)
Spoke with patient and labs will be done the day of her physical.

## 2017-04-24 ENCOUNTER — Encounter: Payer: Self-pay | Admitting: Family Medicine

## 2017-04-24 ENCOUNTER — Ambulatory Visit (INDEPENDENT_AMBULATORY_CARE_PROVIDER_SITE_OTHER): Payer: Self-pay | Admitting: Family Medicine

## 2017-04-24 VITALS — BP 102/70 | HR 74 | Temp 97.7°F | Ht 63.0 in | Wt 150.2 lb

## 2017-04-24 DIAGNOSIS — Z0000 Encounter for general adult medical examination without abnormal findings: Secondary | ICD-10-CM

## 2017-04-24 LAB — CBC WITH DIFFERENTIAL/PLATELET
BASOS PCT: 0.4 % (ref 0.0–3.0)
Basophils Absolute: 0 10*3/uL (ref 0.0–0.1)
EOS ABS: 0.2 10*3/uL (ref 0.0–0.7)
Eosinophils Relative: 2.7 % (ref 0.0–5.0)
HEMATOCRIT: 38.3 % (ref 36.0–46.0)
Hemoglobin: 12.6 g/dL (ref 12.0–15.0)
LYMPHS PCT: 32.2 % (ref 12.0–46.0)
Lymphs Abs: 1.9 10*3/uL (ref 0.7–4.0)
MCHC: 33 g/dL (ref 30.0–36.0)
MCV: 83.2 fl (ref 78.0–100.0)
Monocytes Absolute: 0.4 10*3/uL (ref 0.1–1.0)
Monocytes Relative: 6.9 % (ref 3.0–12.0)
NEUTROS ABS: 3.5 10*3/uL (ref 1.4–7.7)
Neutrophils Relative %: 57.8 % (ref 43.0–77.0)
PLATELETS: 333 10*3/uL (ref 150.0–400.0)
RBC: 4.6 Mil/uL (ref 3.87–5.11)
RDW: 14.3 % (ref 11.5–15.5)
WBC: 6.1 10*3/uL (ref 4.0–10.5)

## 2017-04-24 LAB — TSH: TSH: 0.99 u[IU]/mL (ref 0.35–4.50)

## 2017-04-24 LAB — BASIC METABOLIC PANEL
BUN: 10 mg/dL (ref 6–23)
CHLORIDE: 104 meq/L (ref 96–112)
CO2: 29 mEq/L (ref 19–32)
CREATININE: 0.66 mg/dL (ref 0.40–1.20)
Calcium: 10 mg/dL (ref 8.4–10.5)
GFR: 111.43 mL/min (ref >60.00)
Glucose, Bld: 79 mg/dL (ref 70–99)
Potassium: 4.3 mEq/L (ref 3.5–5.1)
SODIUM: 140 meq/L (ref 135–145)

## 2017-04-24 LAB — LIPID PANEL
Cholesterol: 190 mg/dL (ref 0–200)
HDL: 59.7 mg/dL (ref >39.00)
LDL CALC: 117 mg/dL — AB (ref 0–99)
NonHDL: 129.85
TRIGLYCERIDES: 62 mg/dL (ref 0.0–149.0)
Total CHOL/HDL Ratio: 3
VLDL: 12.4 mg/dL (ref 0.0–40.0)

## 2017-04-24 LAB — HEPATIC FUNCTION PANEL
ALT: 8 U/L (ref 0–35)
AST: 14 U/L (ref 0–37)
Albumin: 4.3 g/dL (ref 3.5–5.2)
Alkaline Phosphatase: 108 U/L (ref 39–117)
BILIRUBIN TOTAL: 0.4 mg/dL (ref 0.2–1.2)
Bilirubin, Direct: 0.1 mg/dL (ref 0.0–0.3)
TOTAL PROTEIN: 7.9 g/dL (ref 6.0–8.3)

## 2017-04-24 NOTE — Progress Notes (Signed)
Subjective:     Patient ID: Jean Dickerson, female   DOB: October 12, 1985, 32 y.o.   MRN: [id number]  HPI   Patient here for physical exam. She has 38-month-old female and things are going well there. She still sees gynecologist. She takes several supplements but no regular medications. She is still breast-feeding. She has long history of acne. She was tried on multiple medications without much improvement. She was briefly on Accutane which worked the best but she definitely has no desire to go back on that  Family history significant for father and mother with hypertension. Father had bipolar disorder. Father she thinks had some type of thyroid disorder but she is not sure details.  She gained some weight during the past year after her pregnancy but hopes to lose some soon. She plans to start exercising more regularly  Past Medical History:  Diagnosis Date  . Allergy   . Anxiety   . Depression    Past Surgical History:  Procedure Laterality Date  . WISDOM TOOTH EXTRACTION      reports that  has never smoked. she has never used smokeless tobacco. She reports that she drinks about 2.4 oz of alcohol per week. She reports that she does not use drugs. family history includes Breast cancer in her paternal aunt; Cancer in her maternal grandmother; Emphysema in her paternal grandmother; Hyperlipidemia in her mother; Hypertension in her father and mother; Liver disease in her maternal grandfather; Lung cancer in her paternal grandfather; Mental illness in her father. No Known Allergies   Review of Systems  Constitutional: Negative for activity change, appetite change, fatigue, fever and unexpected weight change.  HENT: Negative for ear pain, hearing loss, sore throat and trouble swallowing.   Eyes: Negative for visual disturbance.  Respiratory: Negative for cough and shortness of breath.   Cardiovascular: Negative for chest pain and palpitations.  Gastrointestinal: Negative for abdominal pain,  blood in stool, constipation and diarrhea.  Genitourinary: Negative for dysuria and hematuria.  Musculoskeletal: Negative for arthralgias, back pain and myalgias.  Skin: Negative for rash.  Neurological: Negative for dizziness, syncope and headaches.  Hematological: Negative for adenopathy.  Psychiatric/Behavioral: Negative for confusion and dysphoric mood.       Objective:   Physical Exam  Constitutional: She is oriented to person, place, and time. She appears well-developed and well-nourished.  HENT:  Head: Normocephalic and atraumatic.  Eyes: EOM are normal. Pupils are equal, round, and reactive to light.  Neck: Normal range of motion. Neck supple. No thyromegaly present.  Cardiovascular: Normal rate, regular rhythm and normal heart sounds.  No murmur heard. Pulmonary/Chest: Breath sounds normal. No respiratory distress. She has no wheezes. She has no rales.  Abdominal: Soft. Bowel sounds are normal. She exhibits no distension and no mass. There is no tenderness. There is no rebound and no guarding.  Genitourinary:  Genitourinary Comments: Per gyn  Musculoskeletal: Normal range of motion. She exhibits no edema.  Lymphadenopathy:    She has no cervical adenopathy.  Neurological: She is alert and oriented to person, place, and time. She displays normal reflexes. No cranial nerve deficit.  Skin: No rash noted.  Psychiatric: She has a normal mood and affect. Her behavior is normal. Judgment and thought content normal.       Assessment:     Physical exam. Generally healthy 32 year old female    Plan:     -Obtain screening lab work -Discuss healthy strategies for weight loss. She will consider calorie tracking app which has  worked for her in the past  Kristian CoveyBruce W Dee Maday MD [facility]

## 2017-04-27 ENCOUNTER — Other Ambulatory Visit: Payer: Self-pay | Admitting: Obstetrics and Gynecology

## 2017-04-27 ENCOUNTER — Other Ambulatory Visit: Payer: No Typology Code available for payment source

## 2017-04-27 ENCOUNTER — Ambulatory Visit
Admission: RE | Admit: 2017-04-27 | Discharge: 2017-04-27 | Disposition: A | Discharge status: Home / Self Care | Payer: No Typology Code available for payment source | Source: Ambulatory Visit | Attending: Obstetrics and Gynecology | Admitting: Obstetrics and Gynecology

## 2017-04-27 DIAGNOSIS — N6310 Unspecified lump in the right breast, unspecified quadrant: Secondary | ICD-10-CM

## 2017-05-21 ENCOUNTER — Other Ambulatory Visit: Payer: No Typology Code available for payment source

## 2017-07-10 ENCOUNTER — Encounter: Payer: Self-pay | Admitting: Family Medicine

## 2017-07-15 ENCOUNTER — Encounter: Payer: Self-pay | Admitting: Family Medicine

## 2017-07-28 ENCOUNTER — Other Ambulatory Visit: Payer: No Typology Code available for payment source

## 2017-12-30 ENCOUNTER — Encounter: Payer: Self-pay | Admitting: General Practice

## 2018-01-11 ENCOUNTER — Other Ambulatory Visit: Payer: Self-pay

## 2018-01-11 ENCOUNTER — Ambulatory Visit: Payer: Self-pay | Admitting: *Deleted

## 2018-01-11 ENCOUNTER — Encounter: Payer: Self-pay | Admitting: General Practice

## 2018-01-11 DIAGNOSIS — Z3480 Encounter for supervision of other normal pregnancy, unspecified trimester: Secondary | ICD-10-CM | POA: Insufficient documentation

## 2018-01-11 LAB — POCT URINALYSIS DIPSTICK OB
BILIRUBIN UA: NEGATIVE
Blood, UA: NEGATIVE
GLUCOSE, UA: NEGATIVE
Ketones, UA: NEGATIVE
Nitrite, UA: NEGATIVE
PH UA: 6 (ref 5.0–8.0)
POC,PROTEIN,UA: NEGATIVE
Spec Grav, UA: 1.02 (ref 1.010–1.025)
UROBILINOGEN UA: 0.2 U/dL

## 2018-01-11 NOTE — Progress Notes (Signed)
   PRENATAL INTAKE SUMMARY  Ms. Jean Dickerson presents today New OB Nurse Interview.  OB History    Gravida  2   Para  1   Term  1   Preterm      AB      Living  1     SAB      TAB      Ectopic      Multiple  0   Live Births  1          I have reviewed the patient's medical, obstetrical, social, and family histories, medications, and available lab results.  SUBJECTIVE She has no unusual complaints  OBJECTIVE Initial nurse interview for history and lab work (New OB).  EDD: 08/07/2018 BY LMP GA: [redacted]W[redacted]D G2P1001  GENERAL APPEARANCE: alert, well appearing, in no apparent distress, oriented to person, place and time.  ASSESSMENT Normal pregnancy.  PLAN Prenatal care-[facility] Pnl/HIV  OB Urine Culture/dip GC/CT/PAP at next visit with CNM HgbEval SMA/CF ([facility]) [facility] Continue PNV.  Genetic Screening Results Information: You are having genetic testing called [facility] today.  It will take approximately 2 weeks before the results are available.  To get your results, you need Internet access to a web browser to search [place]/MyChart (the direct app on your phone will not give you these results).  Then select Lab Scanned and click on the blue hyper link that says View Image to see your [facility] results.  You can also use the directions on the purple card given to look up your results directly on the [facility] website.  Clovis PuMartin,  L, RN

## 2018-01-11 NOTE — Patient Instructions (Signed)
   Genetic Screening Results Information: You are having genetic testing called [facility] today.  It will take approximately 2 weeks before the results are available.  To get your results, you need Internet access to a web browser to search [place]/MyChart (the direct app on your phone will not give you these results).  Then select Lab Scanned and click on the blue hyper link that says View Image to see your [facility] results.  You can also use the directions on the purple card given to look up your results directly on the [facility] website.  

## 2018-01-12 LAB — OBSTETRIC PANEL, INCLUDING HIV
ANTIBODY SCREEN: NEGATIVE
BASOS: 0 %
Basophils Absolute: 0 10*3/uL (ref 0.0–0.2)
EOS (ABSOLUTE): 0.1 10*3/uL (ref 0.0–0.4)
EOS: 2 %
HEMATOCRIT: 36.2 % (ref 34.0–46.6)
HEMOGLOBIN: 12.2 g/dL (ref 11.1–15.9)
HEP B S AG: NEGATIVE
HIV Screen 4th Generation wRfx: NONREACTIVE
IMMATURE GRANS (ABS): 0 10*3/uL (ref 0.0–0.1)
IMMATURE GRANULOCYTES: 0 %
LYMPHS ABS: 2 10*3/uL (ref 0.7–3.1)
Lymphs: 25 %
MCH: 28.9 pg (ref 26.6–33.0)
MCHC: 33.7 g/dL (ref 31.5–35.7)
MCV: 86 fL (ref 79–97)
Monocytes Absolute: 0.6 10*3/uL (ref 0.1–0.9)
Monocytes: 7 %
NEUTROS PCT: 66 %
Neutrophils Absolute: 5.4 10*3/uL (ref 1.4–7.0)
Platelets: 328 10*3/uL (ref 150–450)
RBC: 4.22 x10E6/uL (ref 3.77–5.28)
RDW: 13.5 % (ref 12.3–15.4)
RH TYPE: POSITIVE
RPR: NONREACTIVE
Rubella Antibodies, IGG: 1.9 index (ref >0.99)
WBC: 8.1 10*3/uL (ref 3.4–10.8)

## 2018-01-12 LAB — SICKLE CELL SCREEN: Sickle Cell Screen: NEGATIVE

## 2018-01-13 LAB — CULTURE, OB URINE

## 2018-01-13 LAB — URINE CULTURE, OB REFLEX

## 2018-01-19 ENCOUNTER — Encounter: Payer: Self-pay | Admitting: General Practice

## 2018-01-27 ENCOUNTER — Encounter: Payer: No Typology Code available for payment source | Admitting: Obstetrics and Gynecology

## 2018-02-11 ENCOUNTER — Ambulatory Visit (INDEPENDENT_AMBULATORY_CARE_PROVIDER_SITE_OTHER): Payer: 59 | Admitting: Obstetrics and Gynecology

## 2018-02-11 DIAGNOSIS — Z1151 Encounter for screening for human papillomavirus (HPV): Secondary | ICD-10-CM

## 2018-02-11 DIAGNOSIS — Z113 Encounter for screening for infections with a predominantly sexual mode of transmission: Secondary | ICD-10-CM

## 2018-02-11 DIAGNOSIS — Z3480 Encounter for supervision of other normal pregnancy, unspecified trimester: Secondary | ICD-10-CM

## 2018-02-11 DIAGNOSIS — Z124 Encounter for screening for malignant neoplasm of cervix: Secondary | ICD-10-CM

## 2018-02-11 NOTE — Progress Notes (Addendum)
Subjective:    Jean Dickerson is being seen today for her first obstetrical visit.  This is a planned pregnancy. She is at 3273w5d gestation. Her obstetrical history is significant for postpartum anemia and hives without anaphylaxis. Relationship with FOB: spouse, living together. Patient does intend to breast feed. Pregnancy history fully reviewed.  Patient reports no bleeding, no contractions, no cramping and no leaking. Weekly mild headaches that resolve with Tylenol and use of essential oils, does not think she is drinking enough water. Nausea managed well with OTC Unisom/B6.  Review of Systems:   Review of Systems  Constitutional: Negative.   HENT: Negative.   Eyes: Negative.   Respiratory: Negative.   Cardiovascular: Negative.   Gastrointestinal: Negative.  Negative for abdominal pain, nausea and vomiting.  Endocrine: Negative.   Genitourinary: Negative.  Negative for vaginal bleeding, vaginal discharge and vaginal pain.  Musculoskeletal: Negative.   Skin: Negative.   Allergic/Immunologic: Negative.   Neurological: Positive for headaches (occasional, respond to Tylenol).  Hematological: Negative.   Psychiatric/Behavioral: Negative.     Objective:     BP 117/73   Pulse 99   Wt 70.8 kg   LMP 10/31/2017 (Exact Date)   BMI 27.63 kg/m  Physical Exam  Nursing note and vitals reviewed. Constitutional: She is oriented to person, place, and time. She appears well-developed and well-nourished. No distress.  HENT:  Head: Normocephalic.  Eyes: Pupils are equal, round, and reactive to light.  Neck: Normal range of motion.  Cardiovascular: Normal rate, regular rhythm and normal heart sounds.  Respiratory: Effort normal and breath sounds normal. No respiratory distress.  GI: Soft. Bowel sounds are normal. She exhibits no distension. There is no abdominal tenderness.  Genitourinary:    Vulva, vagina and uterus normal.     No vaginal discharge.   Musculoskeletal: Normal  range of motion.        General: No edema.  Neurological: She is alert and oriented to person, place, and time.  Skin: Skin is warm and dry. No rash noted. She is not diaphoretic.  Psychiatric: She has a normal mood and affect. Her behavior is normal. Judgment and thought content normal.    Maternal Exam:  Abdomen: Patient reports no abdominal tenderness. Introitus: Normal vulva. Normal vagina.  Vagina is negative for discharge.  Pelvis: adequate for delivery.   Cervix: Cervix evaluated by digital exam.     Fetal Exam Fetal Monitor Review: Mode: hand-held doppler probe.   Baseline rate: 152.         Assessment:    Pregnancy: G2P1001 Patient Active Problem List   Diagnosis Date Noted  . Supervision of other normal pregnancy, antepartum 01/11/2018  . Postpartum care following vaginal delivery 7/26 09/19/2016  . Maternal anemia, with delivery 09/19/2016  . Second-degree perineal laceration, with delivery 09/19/2016  . Migraine without aura 11/04/2015  . Acne 02/15/2015       Plan:     Initial labs & transfer prenatal/GYN records reviewed Encouraged prenatal vitamins. Problem list reviewed and updated. AFP3 discussed: undecided. Role of ultrasound in pregnancy discussed; fetal survey: requested. Amniocentesis discussed: not indicated. The nature of [facility] with multiple MDs and other Advanced Practice Providers was explained to patient; also emphasized that residents, students are part of our team. Follow up in 5 weeks. 50% of 40 min visit spent on counseling and coordination of care.  Will consult with MD for plan of care to prevent postpartum hives.  Bernerd LimboJamilla R Niklaus Mamaril, SNM  02/11/2018   

## 2018-02-11 NOTE — Patient Instructions (Signed)

## 2018-02-15 LAB — CYTOLOGY - PAP
Chlamydia: NEGATIVE
Diagnosis: NEGATIVE
HPV: NOT DETECTED
NEISSERIA GONORRHEA: NEGATIVE

## 2018-02-24 NOTE — L&D Delivery Note (Addendum)
OB/GYN Faculty Practice Delivery Note  Jean Dickerson is a 33 y.o. now G2P2002 s/p SVD at [redacted]w[redacted]d who was admitted for SOL.   ROM: 5h 19m forebag, AROM just before delivery with small amount of clear fluid GBS Status: Negative Maximum Maternal Temperature: 98.6  Delivery Note At 1:03 PM a viable female was delivered via Vaginal, Spontaneous (Presentation: LOA).  APGAR: 9, 9; weight 7 lbs 0.7 oz (3195 g).   Placenta status: spontaneous via Tomasa Blase, intact.  Cord: 3VC with the following notations: thick, tightly wound, short.  Cord pH: n/a  Anesthesia: Epidural  Episiotomy: None Lacerations: 2nd degree;Sulcus;Perineal Suture Repair: 2.0 vicryl rapide Est. Blood Loss (mL): 775  Postpartum Planning [x]  Mom to postpartum.  Baby to Couplet care / Skin to Skin. [x]  plans to breastfeed [x]  message to sent to schedule follow-up  [x]  declined all vaccines  [name], MSN, CNM 07/26/18, 1:49 PM

## 2018-03-04 ENCOUNTER — Encounter (HOSPITAL_COMMUNITY): Payer: Self-pay

## 2018-03-11 ENCOUNTER — Other Ambulatory Visit (HOSPITAL_COMMUNITY): Payer: Self-pay | Admitting: *Deleted

## 2018-03-11 ENCOUNTER — Ambulatory Visit (HOSPITAL_COMMUNITY)
Admission: RE | Admit: 2018-03-11 | Discharge: 2018-03-11 | Disposition: A | Discharge status: Home / Self Care | Payer: 59 | Source: Ambulatory Visit | Attending: Obstetrics and Gynecology | Admitting: Obstetrics and Gynecology

## 2018-03-11 DIAGNOSIS — Z362 Encounter for other antenatal screening follow-up: Secondary | ICD-10-CM

## 2018-03-11 DIAGNOSIS — Z3A18 18 weeks gestation of pregnancy: Secondary | ICD-10-CM

## 2018-03-11 DIAGNOSIS — Z3480 Encounter for supervision of other normal pregnancy, unspecified trimester: Secondary | ICD-10-CM | POA: Insufficient documentation

## 2018-03-11 DIAGNOSIS — Z363 Encounter for antenatal screening for malformations: Secondary | ICD-10-CM | POA: Diagnosis not present

## 2018-03-18 ENCOUNTER — Ambulatory Visit (INDEPENDENT_AMBULATORY_CARE_PROVIDER_SITE_OTHER): Payer: 59 | Admitting: Obstetrics and Gynecology

## 2018-03-18 ENCOUNTER — Encounter: Payer: Self-pay | Admitting: General Practice

## 2018-03-18 VITALS — BP 124/72 | HR 87 | Temp 98.3°F | Wt 164.2 lb

## 2018-03-18 DIAGNOSIS — Z3482 Encounter for supervision of other normal pregnancy, second trimester: Secondary | ICD-10-CM

## 2018-03-18 DIAGNOSIS — Z113 Encounter for screening for infections with a predominantly sexual mode of transmission: Secondary | ICD-10-CM | POA: Diagnosis not present

## 2018-03-18 DIAGNOSIS — O26899 Other specified pregnancy related conditions, unspecified trimester: Principal | ICD-10-CM

## 2018-03-18 DIAGNOSIS — N898 Other specified noninflammatory disorders of vagina: Secondary | ICD-10-CM | POA: Diagnosis not present

## 2018-03-18 DIAGNOSIS — O26892 Other specified pregnancy related conditions, second trimester: Secondary | ICD-10-CM

## 2018-03-18 NOTE — Progress Notes (Signed)
   PRENATAL VISIT NOTE  Subjective:  Jean Dickerson is a 33 y.o. G2P1001 at [redacted]w[redacted]d being seen today for ongoing prenatal care.  She is currently monitored for the following issues for this low-risk pregnancy and has Acne; Migraine without aura; Postpartum care following vaginal delivery 7/26; Maternal anemia, with delivery; Second-degree perineal laceration, with delivery; and Supervision of other normal pregnancy, antepartum on their problem list.  Patient reports vaginal discharge with odor. She did some home treatment with tea tree and coconut oil on a tampon, but wants to make sure her treatment worked.  Contractions: Not present. Vag. Bleeding: None.  Movement: Present. Denies leaking of fluid.   The following portions of the patient's history were reviewed and updated as appropriate: allergies, current medications, past family history, past medical history, past social history, past surgical history and problem list. Problem list updated.  Objective:   Vitals:   03/18/18 0952  BP: 124/72  Pulse: 87  Temp: 98.3 F (36.8 C)  Weight: 74.5 kg    Fetal Status: Fetal Heart Rate (bpm): 145 Fundal Height: 20 cm Movement: Present     General:  Alert, oriented and cooperative. Patient is in no acute distress.  Skin: Skin is warm and dry. No rash noted.   Cardiovascular: Normal heart rate noted  Respiratory: Normal respiratory effort, no problems with respiration noted  Abdomen: Soft, gravid, appropriate for gestational age.  Pain/Pressure: Absent     Pelvic: Cervical exam deferred       Watery, white, vaginal discharge without odor noted on exam.  Extremities: Normal range of motion.  Edema: None  Mental Status: Normal mood and affect. Normal behavior. Normal judgment and thought content.   Assessment and Plan:  Pregnancy: G2P1001 at [redacted]w[redacted]d  1. Encounter for supervision of other normal pregnancy in second trimester  2. Vaginal discharge during pregnancy, antepartum -  Cervicovaginal ancillary only( [place])  Preterm labor symptoms and general obstetric precautions including but not limited to vaginal bleeding, contractions, leaking of fluid and fetal movement were reviewed in detail with the patient. Please refer to After Visit Summary for other counseling recommendations.  Return in about 5 weeks (around 04/22/2018) for ROB.  Future Appointments  Date Time Provider Department Center  04/08/2018 11:00 AM [facility] [place] 3 [facility]-M[facility]US [facility]-US  04/22/2018  9:30 AM [name], CNM [facility] None    Bernerd Limbo, Student-MidWife

## 2018-03-18 NOTE — Progress Notes (Deleted)
cerv

## 2018-03-19 LAB — CERVICOVAGINAL ANCILLARY ONLY
Bacterial vaginitis: NEGATIVE
Candida vaginitis: NEGATIVE
Chlamydia: NEGATIVE
Neisseria Gonorrhea: NEGATIVE
Trichomonas: NEGATIVE

## 2018-03-23 ENCOUNTER — Other Ambulatory Visit: Payer: Self-pay | Admitting: *Deleted

## 2018-03-23 DIAGNOSIS — Z3480 Encounter for supervision of other normal pregnancy, unspecified trimester: Secondary | ICD-10-CM

## 2018-03-23 MED ORDER — ONDANSETRON 4 MG PO TBDP: 4.0000 mg | ORAL_TABLET | Freq: Three times a day (TID) | ORAL | 0 refills | Status: DC | PRN

## 2018-03-23 NOTE — Progress Notes (Signed)
Patient called stating there was a prescription for Zofran was suppose to be sen to the pharmacy at last office visit. Confirmed with [name], CNM and medication was sent to pharmacy.  Clovis Pu, RN

## 2018-04-08 ENCOUNTER — Ambulatory Visit (HOSPITAL_COMMUNITY)
Admission: RE | Admit: 2018-04-08 | Discharge: 2018-04-08 | Disposition: A | Discharge status: Home / Self Care | Payer: 59 | Source: Ambulatory Visit | Attending: Nurse Practitioner | Admitting: Nurse Practitioner

## 2018-04-08 ENCOUNTER — Encounter (HOSPITAL_COMMUNITY): Payer: Self-pay

## 2018-04-08 DIAGNOSIS — Z362 Encounter for other antenatal screening follow-up: Secondary | ICD-10-CM | POA: Insufficient documentation

## 2018-04-08 DIAGNOSIS — Z3A22 22 weeks gestation of pregnancy: Secondary | ICD-10-CM | POA: Diagnosis not present

## 2018-04-22 ENCOUNTER — Ambulatory Visit (INDEPENDENT_AMBULATORY_CARE_PROVIDER_SITE_OTHER): Payer: 59 | Admitting: Obstetrics and Gynecology

## 2018-04-22 DIAGNOSIS — Z3480 Encounter for supervision of other normal pregnancy, unspecified trimester: Secondary | ICD-10-CM

## 2018-04-22 DIAGNOSIS — Z3482 Encounter for supervision of other normal pregnancy, second trimester: Secondary | ICD-10-CM

## 2018-04-22 DIAGNOSIS — Z3A24 24 weeks gestation of pregnancy: Secondary | ICD-10-CM

## 2018-04-22 NOTE — Progress Notes (Signed)
   PRENATAL VISIT NOTE  Subjective:  Jean Dickerson is a 33 y.o. G2P1001 at [redacted]w[redacted]d being seen today for ongoing prenatal care.  She is currently monitored for the following issues for this low-risk pregnancy and has Acne; Migraine without aura; and Supervision of other normal pregnancy, antepartum on their problem list.  Patient reports no complaints, had questions about waterbirth. Has signed up for the class, will bring in certificate. Considering hiring a doula.  Contractions: Regular.  .  Movement: Present. Denies leaking of fluid.   The following portions of the patient's history were reviewed and updated as appropriate: allergies, current medications, past family history, past medical history, past social history, past surgical history and problem list. Problem list updated.  Objective:   Vitals:   04/22/18 0934  BP: 126/76  Pulse: (!) 108  Weight: 77.6 kg    Fetal Status: Fetal Heart Rate (bpm): 146 Fundal Height: 24 cm Movement: Present     General:  Alert, oriented and cooperative. Patient is in no acute distress.  Skin: Skin is warm and dry. No rash noted.   Cardiovascular: Normal heart rate noted  Respiratory: Normal respiratory effort, no problems with respiration noted  Abdomen: Soft, gravid, appropriate for gestational age.  Pain/Pressure: Absent     Pelvic: Cervical exam deferred        Extremities: Normal range of motion.  Edema: None  Mental Status: Normal mood and affect. Normal behavior. Normal judgment and thought content.   Assessment and Plan:  Pregnancy: G2P1001 at [redacted]w[redacted]d  1. Supervision of other normal pregnancy, antepartum - Anticipatory guidance about next visit (ROB with 2hr GTT) - Discussed new waterbirth guidelines, restrictions, and tub usage  Preterm labor symptoms and general obstetric precautions including but not limited to vaginal bleeding, contractions, leaking of fluid and fetal movement were reviewed in detail with the patient. Please  refer to After Visit Summary for other counseling recommendations.  Return in about 4 weeks (around 05/20/2018) for ROB & GTT.  Future Appointments  Date Time Provider Department Center  05/12/2018  8:30 AM [facility] None  05/12/2018  9:50 AM [name], CNM [facility] None    Bernerd Limbo, Student-MidWife

## 2018-04-22 NOTE — Patient Instructions (Signed)
Thinking About Linden Dolin???  You must attend a Linden Dolin class at [facility]  3rd Wednesday of every month from [employer] by calling [phone] or online at [email or web address]  Bring [place] the certificate from the class - we must have this on file for you to have a water labor/birth.  Waterbirth supplies needed for [facility] patients:  Our practice has two mobile tubs for use, you may not bring an outside tub for set up/use.  Things that would prevent you from having a waterbirth:  Premature, <37wks  Previous cesarean birth  Presence of thick meconium-stained fluid  Multiple gestation (Twins, triplets, etc.)  Uncontrolled diabetes  Hypertension  Heavy vaginal bleeding  Non-reassuring fetal heart rate  Active infection (MRSA, etc.)  If your labor has to be induced  If all tubs are currently in use  Other risk issues identified by your obstetrical provider

## 2018-05-12 ENCOUNTER — Ambulatory Visit (INDEPENDENT_AMBULATORY_CARE_PROVIDER_SITE_OTHER): Payer: 59 | Admitting: Obstetrics and Gynecology

## 2018-05-12 ENCOUNTER — Other Ambulatory Visit: Payer: Self-pay

## 2018-05-12 ENCOUNTER — Other Ambulatory Visit: Payer: 59 | Admitting: *Deleted

## 2018-05-12 ENCOUNTER — Encounter: Payer: Self-pay | Admitting: Obstetrics and Gynecology

## 2018-05-12 VITALS — BP 120/75 | HR 90 | Temp 97.8°F | Wt 173.2 lb

## 2018-05-12 DIAGNOSIS — Z3A27 27 weeks gestation of pregnancy: Secondary | ICD-10-CM

## 2018-05-12 DIAGNOSIS — Z3480 Encounter for supervision of other normal pregnancy, unspecified trimester: Secondary | ICD-10-CM

## 2018-05-12 DIAGNOSIS — Z3482 Encounter for supervision of other normal pregnancy, second trimester: Secondary | ICD-10-CM

## 2018-05-12 NOTE — Patient Instructions (Signed)

## 2018-05-12 NOTE — Progress Notes (Signed)
   Patient in clinic for 28 week lab work.  Vedder Brittian L, RN  

## 2018-05-12 NOTE — Progress Notes (Signed)
   PRENATAL VISIT NOTE  Subjective:  Jean Dickerson is a 33 y.o. G2P1001 at [redacted]w[redacted]d being seen today for ongoing prenatal care.  She is currently monitored for the following issues for this low-risk pregnancy and has Acne; Migraine without aura; and Supervision of other normal pregnancy, antepartum on their problem list.  Patient reports occasional contractions x 1 day, but none since that day. She does report some occ. LLQ pain. She thinks it's from constipation. She states she missed a couple of days of taking magnesium supplements, but has started back taking them. Contractions: Not present. Vag. Bleeding: None.  Movement: Present. Denies leaking of fluid.   The following portions of the patient's history were reviewed and updated as appropriate: allergies, current medications, past family history, past medical history, past social history, past surgical history and problem list.   Objective:   Vitals:   05/12/18 0844  BP: 120/75  Pulse: 90  Temp: 97.8 F (36.6 C)  Weight: 173 lb 3.2 oz (78.6 kg)    Fetal Status: Fetal Heart Rate (bpm): 156 Fundal Height: 28 cm Movement: Present     General:  Alert, oriented and cooperative. Patient is in no acute distress.  Skin: Skin is warm and dry. No rash noted.   Cardiovascular: Normal heart rate noted  Respiratory: Normal respiratory effort, no problems with respiration noted  Abdomen: Soft, gravid, appropriate for gestational age.  Pain/Pressure: Present     Pelvic: Cervical exam deferred        Extremities: Normal range of motion.  Edema: None  Mental Status: Normal mood and affect. Normal behavior. Normal judgment and thought content.   Assessment and Plan:  Pregnancy: G2P1001 at [redacted]w[redacted]d 1. Supervision of other normal pregnancy, antepartum - Taking waterbirth class on 06/16/2018; plans to labor in water, but not opposed to delivering in the water - Glucose Tolerance, 2 Hours w/1 Hour - HIV Antibody (routine testing w rflx) - RPR -  CBC  Preterm labor symptoms and general obstetric precautions including but not limited to vaginal bleeding, contractions, leaking of fluid and fetal movement were reviewed in detail with the patient. Please refer to After Visit Summary for other counseling recommendations.   Return in about 2 weeks (around 05/26/2018) for Return OB visit.  Future Appointments  Date Time Provider Department Center  05/12/2018  9:50 AM [name], CNM [facility] None    [name], [place]

## 2018-05-13 LAB — GLUCOSE TOLERANCE, 2 HOURS W/ 1HR
Glucose, 1 hour: 161 mg/dL (ref 65–179)
Glucose, 2 hour: 118 mg/dL (ref 65–152)
Glucose, Fasting: 75 mg/dL (ref 65–91)

## 2018-05-13 LAB — CBC
Hematocrit: 30.7 % — ABNORMAL LOW (ref 34.0–46.6)
Hemoglobin: 10.2 g/dL — ABNORMAL LOW (ref 11.1–15.9)
MCH: 28.6 pg (ref 26.6–33.0)
MCHC: 33.2 g/dL (ref 31.5–35.7)
MCV: 86 fL (ref 79–97)
Platelets: 311 10*3/uL (ref 150–450)
RBC: 3.57 x10E6/uL — ABNORMAL LOW (ref 3.77–5.28)
RDW: 13.2 % (ref 11.7–15.4)
WBC: 9.5 10*3/uL (ref 3.4–10.8)

## 2018-05-13 LAB — HIV ANTIBODY (ROUTINE TESTING W REFLEX): HIV Screen 4th Generation wRfx: NONREACTIVE

## 2018-05-13 LAB — RPR: RPR Ser Ql: NONREACTIVE

## 2018-05-21 ENCOUNTER — Other Ambulatory Visit: Payer: Self-pay | Admitting: *Deleted

## 2018-05-21 DIAGNOSIS — R12 Heartburn: Principal | ICD-10-CM

## 2018-05-21 DIAGNOSIS — O26899 Other specified pregnancy related conditions, unspecified trimester: Secondary | ICD-10-CM

## 2018-05-21 MED ORDER — FAMOTIDINE 20 MG PO TABS: 20.0000 mg | ORAL_TABLET | Freq: Two times a day (BID) | ORAL | 3 refills | Status: DC

## 2018-05-21 NOTE — Progress Notes (Signed)
Sending in Pepcid 20 mg BID.  Clovis Pu, RN

## 2018-05-26 ENCOUNTER — Ambulatory Visit (INDEPENDENT_AMBULATORY_CARE_PROVIDER_SITE_OTHER): Payer: 59 | Admitting: Obstetrics and Gynecology

## 2018-05-26 ENCOUNTER — Encounter: Payer: Self-pay | Admitting: Obstetrics and Gynecology

## 2018-05-26 ENCOUNTER — Other Ambulatory Visit: Payer: Self-pay

## 2018-05-26 DIAGNOSIS — R12 Heartburn: Secondary | ICD-10-CM

## 2018-05-26 DIAGNOSIS — N949 Unspecified condition associated with female genital organs and menstrual cycle: Secondary | ICD-10-CM

## 2018-05-26 DIAGNOSIS — R102 Pelvic and perineal pain: Secondary | ICD-10-CM

## 2018-05-26 DIAGNOSIS — Z3483 Encounter for supervision of other normal pregnancy, third trimester: Secondary | ICD-10-CM

## 2018-05-26 DIAGNOSIS — O26893 Other specified pregnancy related conditions, third trimester: Secondary | ICD-10-CM

## 2018-05-26 DIAGNOSIS — O26899 Other specified pregnancy related conditions, unspecified trimester: Secondary | ICD-10-CM

## 2018-05-26 MED ORDER — CEPHALEXIN 500 MG PO CAPS: 500.0000 mg | ORAL_CAPSULE | Freq: Four times a day (QID) | ORAL | 0 refills | Status: AC

## 2018-05-26 NOTE — Progress Notes (Signed)
   TELEHEALTH VIRTUAL OBSTETRICS VISIT ENCOUNTER NOTE  I connected with Jean Dickerson on 05/26/18 at  4:10 PM EDT by telephone at home and verified that I am speaking with the correct person using two identifiers.   I discussed the limitations, risks, security and privacy concerns of performing an evaluation and management service by telephone and the availability of in person appointments. I also discussed with the patient that there may be a patient responsible charge related to this service. The patient expressed understanding and agreed to proceed.  Subjective:  Jean Dickerson is a 33 y.o. G2P1001 at [redacted]w[redacted]d being followed for ongoing prenatal care.  She is currently monitored for the following issues for this low-risk pregnancy and has Acne; Migraine without aura; and Supervision of other normal pregnancy, antepartum on their problem list.  Patient reports heartburn, no bleeding, no leaking, occasional contractions and "feeling like I have a UTI. I have increased pressure in my bladder and increased urgency x 2-3 days. She only wears maternity belt when she is up and moving around, because "it hurts too much to wear it." She declines taking abx "unless knows for sure there is a UTI." Reports fetal movement. Denies any contractions, bleeding or leaking of fluid.   The following portions of the patient's history were reviewed and updated as appropriate: allergies, current medications, past family history, past medical history, past social history, past surgical history and problem list.   Objective:   General:  Alert, oriented and cooperative.   Mental Status: Normal mood and affect perceived. Normal judgment and thought content.  Rest of physical exam deferred due to type of encounter  Assessment and Plan:  Pregnancy: G2P1001 at [redacted]w[redacted]d  1. Heartburn during pregnancy, antepartum - Takes papaya enzymes daily and has relief from acid reflux  2. Pelvic pressure in pregnancy,  antepartum, third trimester - Taking a daily cranberry pill to alleviate sx's - Offered to drop urine sample off at office on 4/2 or 4/3 for UCx, if not feeling better  3. Encounter for supervision of other normal pregnancy in third trimester - Anticipatory guidance for nv to be Webex appt  Preterm labor symptoms and general obstetric precautions including but not limited to vaginal bleeding, contractions, leaking of fluid and fetal movement were reviewed in detail with the patient.  I discussed the assessment and treatment plan with the patient. The patient was provided an opportunity to ask questions and all were answered. The patient agreed with the plan and demonstrated an understanding of the instructions. The patient was advised to call back or seek an in-person office evaluation/go to [facility]U at [facility] for any urgent or concerning symptoms. Please refer to After Visit Summary for other counseling recommendations.   I provided 15 minutes of non-face-to-face time during this encounter.   Future Appointments  Date Time Provider Department Center  06/09/2018  4:10 PM [name], CNM [facility] None  06/23/2018  4:10 PM [name], CNM [facility] None  07/08/2018  3:10 PM [name], CNM [facility] None  07/15/2018  3:10 PM [name], CNM [facility] None  07/22/2018  3:10 PM [name], CNM [facility] None    [name], CNM Center for [employer], [facility]

## 2018-06-09 ENCOUNTER — Encounter: Payer: 59 | Admitting: Obstetrics and Gynecology

## 2018-06-23 ENCOUNTER — Encounter: Payer: 59 | Admitting: Obstetrics and Gynecology

## 2018-07-08 ENCOUNTER — Encounter: Payer: 59 | Admitting: Obstetrics and Gynecology

## 2018-07-15 ENCOUNTER — Ambulatory Visit (INDEPENDENT_AMBULATORY_CARE_PROVIDER_SITE_OTHER): Payer: 59 | Admitting: Obstetrics and Gynecology

## 2018-07-15 ENCOUNTER — Other Ambulatory Visit: Payer: Self-pay

## 2018-07-15 VITALS — BP 131/76 | HR 105 | Temp 98.3°F | Wt 190.0 lb

## 2018-07-15 DIAGNOSIS — Z113 Encounter for screening for infections with a predominantly sexual mode of transmission: Secondary | ICD-10-CM | POA: Diagnosis not present

## 2018-07-15 DIAGNOSIS — Z3A36 36 weeks gestation of pregnancy: Secondary | ICD-10-CM

## 2018-07-15 DIAGNOSIS — Z3483 Encounter for supervision of other normal pregnancy, third trimester: Secondary | ICD-10-CM

## 2018-07-15 DIAGNOSIS — Z3480 Encounter for supervision of other normal pregnancy, unspecified trimester: Secondary | ICD-10-CM

## 2018-07-15 LAB — OB RESULTS CONSOLE GBS: GBS: NEGATIVE

## 2018-07-15 NOTE — Patient Instructions (Signed)
Braxton Hicks Contractions Contractions of the uterus can occur throughout pregnancy, but they are not always a sign that you are in labor. You may have practice contractions called Braxton Hicks contractions. These false labor contractions are sometimes confused with true labor. What are Braxton Hicks contractions? Braxton Hicks contractions are tightening movements that occur in the muscles of the uterus before labor. Unlike true labor contractions, these contractions do not result in opening (dilation) and thinning of the cervix. Toward the end of pregnancy (32-34 weeks), Braxton Hicks contractions can happen more often and may become stronger. These contractions are sometimes difficult to tell apart from true labor because they can be very uncomfortable. You should not feel embarrassed if you go to the hospital with false labor. Sometimes, the only way to tell if you are in true labor is for your health care provider to look for changes in the cervix. The health care provider will do a physical exam and may monitor your contractions. If you are not in true labor, the exam should show that your cervix is not dilating and your water has not broken. If there are no other health problems associated with your pregnancy, it is completely safe for you to be sent home with false labor. You may continue to have Braxton Hicks contractions until you go into true labor. How to tell the difference between true labor and false labor True labor  Contractions last 30-70 seconds.  Contractions become very regular.  Discomfort is usually felt in the top of the uterus, and it spreads to the lower abdomen and low back.  Contractions do not go away with walking.  Contractions usually become more intense and increase in frequency.  The cervix dilates and gets thinner. False labor  Contractions are usually shorter and not as strong as true labor contractions.  Contractions are usually irregular.  Contractions  are often felt in the front of the lower abdomen and in the groin.  Contractions may go away when you walk around or change positions while lying down.  Contractions get weaker and are shorter-lasting as time goes on.  The cervix usually does not dilate or become thin. Follow these instructions at home:   Take over-the-counter and prescription medicines only as told by your health care provider.  Keep up with your usual exercises and follow other instructions from your health care provider.  Eat and drink lightly if you think you are going into labor.  If Braxton Hicks contractions are making you uncomfortable: ? Change your position from lying down or resting to walking, or change from walking to resting. ? Sit and rest in a tub of warm water. ? Drink enough fluid to keep your urine pale yellow. Dehydration may cause these contractions. ? Do slow and deep breathing several times an hour.  Keep all follow-up prenatal visits as told by your health care provider. This is important. Contact a health care provider if:  You have a fever.  You have continuous pain in your abdomen. Get help right away if:  Your contractions become stronger, more regular, and closer together.  You have fluid leaking or gushing from your vagina.  You pass blood-tinged mucus (bloody show).  You have bleeding from your vagina.  You have low back pain that you never had before.  You feel your baby's head pushing down and causing pelvic pressure.  Your baby is not moving inside you as much as it used to. Summary  Contractions that occur before labor are   called Braxton Hicks contractions, false labor, or practice contractions.  Braxton Hicks contractions are usually shorter, weaker, farther apart, and less regular than true labor contractions. True labor contractions usually become progressively stronger and regular, and they become more frequent.  Manage discomfort from Braxton Hicks contractions  by changing position, resting in a warm bath, drinking plenty of water, or practicing deep breathing. This information is not intended to replace advice given to you by your health care provider. Make sure you discuss any questions you have with your health care provider. Document Released: 06/26/2016 Document Revised: 11/25/2016 Document Reviewed: 06/26/2016 Elsevier Interactive Patient Education  2019 [employer]  

## 2018-07-15 NOTE — Progress Notes (Signed)
   PRENATAL VISIT NOTE  Subjective:  Jean Dickerson is a 33 y.o. G2P1001 at [redacted]w[redacted]d being seen today for ongoing prenatal care.  She is currently monitored for the following issues for this low-risk pregnancy and has Acne; Migraine without aura; and Supervision of other normal pregnancy, antepartum on their problem list.  Patient reports pelvic pressure. She plans to labor at home as long as she can with doula, Myrene Buddy, and her spouse.  Contractions: Irregular. Vag. Bleeding: None.  Movement: Present. Denies leaking of fluid.   The following portions of the patient's history were reviewed and updated as appropriate: allergies, current medications, past family history, past medical history, past social history, past surgical history and problem list.   Objective:   Vitals:   07/15/18 1509  BP: 131/76  Pulse: (!) 105  Temp: 98.3 F (36.8 C)  Weight: 190 lb (86.2 kg)    Fetal Status: Fetal Heart Rate (bpm): 140 Fundal Height: 36 cm Movement: Present  Presentation: Vertex  General:  Alert, oriented and cooperative. Patient is in no acute distress.  Skin: Skin is warm and dry. No rash noted.   Cardiovascular: Normal heart rate noted  Respiratory: Normal respiratory effort, no problems with respiration noted  Abdomen: Soft, gravid, appropriate for gestational age.  Pain/Pressure: Present     Pelvic: Cervical exam performed Dilation: 1 Effacement (%): 60 Station: Ballotable  Extremities: Normal range of motion.  Edema: None  Mental Status: Normal mood and affect. Normal behavior. Normal judgment and thought content.   Assessment and Plan:  Pregnancy: G2P1001 at [redacted]w[redacted]d 1. Supervision of other normal pregnancy, antepartum - Discussed the COVID-19 testing on all admitted labor patients  patient states that she "probably is not going to do that"  - Advised that the testing is to protect her, her baby, staff, and other patients - If she decided to decline COVID-19 testing, she will be  asked to wear a mask the entire time she is in the hospital, staff will have to manage her as if she were (+) for COVID-19 and pediatrician  - Culture, beta strep (group b only) - Cervicovaginal ancillary only( [place])  Preterm labor symptoms and general obstetric precautions including but not limited to vaginal bleeding, contractions, leaking of fluid and fetal movement were reviewed in detail with the patient. Please refer to After Visit Summary for other counseling recommendations.   Return in about 1 week (around 07/22/2018) for Return OB - My Chart video.  Future Appointments  Date Time Provider Department Center  07/22/2018  9:30 AM [name], CNM [facility] None  07/29/2018 11:10 AM [name], CNM [facility] None  08/05/2018 11:10 AM [name], CNM [facility] None    [name], CNM

## 2018-07-16 LAB — CERVICOVAGINAL ANCILLARY ONLY
Chlamydia: NEGATIVE
Neisseria Gonorrhea: NEGATIVE

## 2018-07-17 ENCOUNTER — Encounter: Payer: Self-pay | Admitting: Obstetrics and Gynecology

## 2018-07-19 LAB — CULTURE, BETA STREP (GROUP B ONLY): Strep Gp B Culture: NEGATIVE

## 2018-07-22 ENCOUNTER — Encounter: Payer: 59 | Admitting: Obstetrics and Gynecology

## 2018-07-22 ENCOUNTER — Ambulatory Visit (INDEPENDENT_AMBULATORY_CARE_PROVIDER_SITE_OTHER): Payer: 59 | Admitting: Obstetrics and Gynecology

## 2018-07-22 ENCOUNTER — Encounter: Payer: Self-pay | Admitting: Obstetrics and Gynecology

## 2018-07-22 ENCOUNTER — Other Ambulatory Visit: Payer: Self-pay

## 2018-07-22 VITALS — BP 121/79 | HR 89 | Temp 98.1°F | Wt 191.6 lb

## 2018-07-22 DIAGNOSIS — Z3480 Encounter for supervision of other normal pregnancy, unspecified trimester: Secondary | ICD-10-CM

## 2018-07-22 DIAGNOSIS — N949 Unspecified condition associated with female genital organs and menstrual cycle: Secondary | ICD-10-CM

## 2018-07-22 DIAGNOSIS — Z3A37 37 weeks gestation of pregnancy: Secondary | ICD-10-CM

## 2018-07-22 DIAGNOSIS — O26893 Other specified pregnancy related conditions, third trimester: Secondary | ICD-10-CM

## 2018-07-22 DIAGNOSIS — R102 Pelvic and perineal pain: Secondary | ICD-10-CM

## 2018-07-22 NOTE — Progress Notes (Signed)
   PRENATAL VISIT NOTE  Subjective:  Jean Dickerson is a 33 y.o. G2P1001 at [redacted]w[redacted]d being seen today for ongoing prenatal care.  She is currently monitored for the following issues for this low-risk pregnancy and has Acne; Migraine without aura; and Supervision of other normal pregnancy, antepartum on their problem list.  Patient reports occasional contractions. She also reports taking RRLT, EPO, Clary Sage and other alternatives to stimulate labor. She is scheduled to close on her new house as well as the sell of her current town home on 08/04/18. Her desire is to "have this baby before then, so the stress of going in to labor during all of that is not an issue." Contractions: Irregular. Vag. Bleeding: None.  Movement: Present. Denies leaking of fluid.   The following portions of the patient's history were reviewed and updated as appropriate: allergies, current medications, past family history, past medical history, past social history, past surgical history and problem list.   Objective:   Vitals:   07/22/18 0929  BP: 121/79  Pulse: 89  Temp: 98.1 F (36.7 C)  Weight: 191 lb 9.6 oz (86.9 kg)    Fetal Status: Fetal Heart Rate (bpm): 144 Fundal Height: 38 cm Movement: Present  Presentation: Vertex  General:  Alert, oriented and cooperative. Patient is in no acute distress.  Skin: Skin is warm and dry. No rash noted.   Cardiovascular: Normal heart rate noted  Respiratory: Normal respiratory effort, no problems with respiration noted  Abdomen: Soft, gravid, appropriate for gestational age.  Pain/Pressure: Present     Pelvic: Cervical exam performed Dilation: 2.5 Effacement (%): 60 Station: Ballotable  Extremities: Normal range of motion.  Edema: Trace  Mental Status: Normal mood and affect. Normal behavior. Normal judgment and thought content.   Assessment and Plan:  Pregnancy: G2P1001 at [redacted]w[redacted]d 1. Supervision of other normal pregnancy, antepartum - Continue with RRLT, EPO and  Clary Sage - Plan in-office visit next week, unless delivered  2. Pelvic pressure in pregnancy, antepartum, third trimester - Advised that taking those supplements will stimulate labor and cause increase pelvic pressure.  Term labor symptoms and general obstetric precautions including but not limited to vaginal bleeding, contractions, leaking of fluid and fetal movement were reviewed in detail with the patient. Please refer to After Visit Summary for other counseling recommendations.   Return in about 1 week (around 07/29/2018) for Return OB visit.  Future Appointments  Date Time Provider Department Center  07/29/2018 11:10 AM [name], CNM [facility] None  08/05/2018 11:10 AM [name], CNM [facility] None    [name], CNM

## 2018-07-22 NOTE — Patient Instructions (Signed)
Braxton Hicks Contractions Contractions of the uterus can occur throughout pregnancy, but they are not always a sign that you are in labor. You may have practice contractions called Braxton Hicks contractions. These false labor contractions are sometimes confused with true labor. What are Braxton Hicks contractions? Braxton Hicks contractions are tightening movements that occur in the muscles of the uterus before labor. Unlike true labor contractions, these contractions do not result in opening (dilation) and thinning of the cervix. Toward the end of pregnancy (32-34 weeks), Braxton Hicks contractions can happen more often and may become stronger. These contractions are sometimes difficult to tell apart from true labor because they can be very uncomfortable. You should not feel embarrassed if you go to the hospital with false labor. Sometimes, the only way to tell if you are in true labor is for your health care provider to look for changes in the cervix. The health care provider will do a physical exam and may monitor your contractions. If you are not in true labor, the exam should show that your cervix is not dilating and your water has not broken. If there are no other health problems associated with your pregnancy, it is completely safe for you to be sent home with false labor. You may continue to have Braxton Hicks contractions until you go into true labor. How to tell the difference between true labor and false labor True labor  Contractions last 30-70 seconds.  Contractions become very regular.  Discomfort is usually felt in the top of the uterus, and it spreads to the lower abdomen and low back.  Contractions do not go away with walking.  Contractions usually become more intense and increase in frequency.  The cervix dilates and gets thinner. False labor  Contractions are usually shorter and not as strong as true labor contractions.  Contractions are usually irregular.  Contractions  are often felt in the front of the lower abdomen and in the groin.  Contractions may go away when you walk around or change positions while lying down.  Contractions get weaker and are shorter-lasting as time goes on.  The cervix usually does not dilate or become thin. Follow these instructions at home:   Take over-the-counter and prescription medicines only as told by your health care provider.  Keep up with your usual exercises and follow other instructions from your health care provider.  Eat and drink lightly if you think you are going into labor.  If Braxton Hicks contractions are making you uncomfortable: ? Change your position from lying down or resting to walking, or change from walking to resting. ? Sit and rest in a tub of warm water. ? Drink enough fluid to keep your urine pale yellow. Dehydration may cause these contractions. ? Do slow and deep breathing several times an hour.  Keep all follow-up prenatal visits as told by your health care provider. This is important. Contact a health care provider if:  You have a fever.  You have continuous pain in your abdomen. Get help right away if:  Your contractions become stronger, more regular, and closer together.  You have fluid leaking or gushing from your vagina.  You pass blood-tinged mucus (bloody show).  You have bleeding from your vagina.  You have low back pain that you never had before.  You feel your baby's head pushing down and causing pelvic pressure.  Your baby is not moving inside you as much as it used to. Summary  Contractions that occur before labor are   called Braxton Hicks contractions, false labor, or practice contractions.  Braxton Hicks contractions are usually shorter, weaker, farther apart, and less regular than true labor contractions. True labor contractions usually become progressively stronger and regular, and they become more frequent.  Manage discomfort from Braxton Hicks contractions  by changing position, resting in a warm bath, drinking plenty of water, or practicing deep breathing. This information is not intended to replace advice given to you by your health care provider. Make sure you discuss any questions you have with your health care provider. Document Released: 06/26/2016 Document Revised: 11/25/2016 Document Reviewed: 06/26/2016 Elsevier Interactive Patient Education  2019 [employer]  

## 2018-07-24 ENCOUNTER — Encounter: Payer: Self-pay | Admitting: Obstetrics and Gynecology

## 2018-07-26 ENCOUNTER — Inpatient Hospital Stay (HOSPITAL_COMMUNITY): Payer: 59 | Admitting: Anesthesiology

## 2018-07-26 ENCOUNTER — Encounter (HOSPITAL_COMMUNITY): Payer: Self-pay

## 2018-07-26 ENCOUNTER — Inpatient Hospital Stay (HOSPITAL_COMMUNITY)
Admission: AD | Admit: 2018-07-26 | Discharge: 2018-07-27 | DRG: 807 | Disposition: A | Discharge status: Home / Self Care | Payer: 59 | Attending: Obstetrics & Gynecology | Admitting: Obstetrics & Gynecology

## 2018-07-26 ENCOUNTER — Other Ambulatory Visit: Payer: Self-pay

## 2018-07-26 DIAGNOSIS — Z3A38 38 weeks gestation of pregnancy: Secondary | ICD-10-CM

## 2018-07-26 DIAGNOSIS — D649 Anemia, unspecified: Secondary | ICD-10-CM | POA: Diagnosis present

## 2018-07-26 DIAGNOSIS — O9902 Anemia complicating childbirth: Principal | ICD-10-CM | POA: Diagnosis present

## 2018-07-26 DIAGNOSIS — Z1159 Encounter for screening for other viral diseases: Secondary | ICD-10-CM

## 2018-07-26 DIAGNOSIS — Z3480 Encounter for supervision of other normal pregnancy, unspecified trimester: Secondary | ICD-10-CM

## 2018-07-26 DIAGNOSIS — O26893 Other specified pregnancy related conditions, third trimester: Secondary | ICD-10-CM | POA: Diagnosis present

## 2018-07-26 LAB — CBC
HCT: 32.7 % — ABNORMAL LOW (ref 36.0–46.0)
Hemoglobin: 10.4 g/dL — ABNORMAL LOW (ref 12.0–15.0)
MCH: 26.4 pg (ref 26.0–34.0)
MCHC: 31.8 g/dL (ref 30.0–36.0)
MCV: 83 fL (ref 80.0–100.0)
Platelets: 272 10*3/uL (ref 150–400)
RBC: 3.94 MIL/uL (ref 3.87–5.11)
RDW: 14.5 % (ref 11.5–15.5)
WBC: 11.7 10*3/uL — ABNORMAL HIGH (ref 4.0–10.5)
nRBC: 0 % (ref 0.0–0.2)

## 2018-07-26 LAB — ABO/RH: ABO/RH(D): A POS

## 2018-07-26 LAB — TYPE AND SCREEN
ABO/RH(D): A POS
Antibody Screen: NEGATIVE

## 2018-07-26 LAB — RPR: RPR Ser Ql: NONREACTIVE

## 2018-07-26 LAB — SARS CORONAVIRUS 2 BY RT PCR (HOSPITAL ORDER, PERFORMED IN ~~LOC~~ HOSPITAL LAB): SARS Coronavirus 2: NEGATIVE

## 2018-07-26 MED ORDER — LIDOCAINE HCL (PF) 1 % IJ SOLN
INTRAMUSCULAR | Status: DC | PRN
  Administered 2018-07-26: 7 mL via EPIDURAL
  Administered 2018-07-26: 5 mL via EPIDURAL

## 2018-07-26 MED ORDER — SIMETHICONE 80 MG PO CHEW: 80.0000 mg | CHEWABLE_TABLET | ORAL | Status: DC | PRN

## 2018-07-26 MED ORDER — FENTANYL-BUPIVACAINE-NACL 0.5-0.125-0.9 MG/250ML-% EP SOLN
12.0000 mL/h | EPIDURAL | Status: DC | PRN
  Filled 2018-07-26: qty 250

## 2018-07-26 MED ORDER — TETANUS-DIPHTH-ACELL PERTUSSIS 5-2.5-18.5 LF-MCG/0.5 IM SUSP: 0.5000 mL | Freq: Once | INTRAMUSCULAR | Status: DC

## 2018-07-26 MED ORDER — ONDANSETRON HCL 4 MG/2ML IJ SOLN: 4.0000 mg | INTRAMUSCULAR | Status: DC | PRN

## 2018-07-26 MED ORDER — ACETAMINOPHEN 325 MG PO TABS
650.0000 mg | ORAL_TABLET | ORAL | Status: DC | PRN
  Administered 2018-07-26 – 2018-07-27 (×3): 650 mg via ORAL
  Filled 2018-07-26 (×3): qty 2

## 2018-07-26 MED ORDER — OXYTOCIN BOLUS FROM INFUSION: 500.0000 mL | Freq: Once | INTRAVENOUS | Status: DC

## 2018-07-26 MED ORDER — OXYCODONE-ACETAMINOPHEN 5-325 MG PO TABS: 2.0000 | ORAL_TABLET | ORAL | Status: DC | PRN

## 2018-07-26 MED ORDER — OXYTOCIN 40 UNITS IN NORMAL SALINE INFUSION - SIMPLE MED
2.5000 [IU]/h | INTRAVENOUS | Status: DC
  Filled 2018-07-26: qty 1000

## 2018-07-26 MED ORDER — DIBUCAINE (PERIANAL) 1 % EX OINT: 1.0000 "application " | TOPICAL_OINTMENT | CUTANEOUS | Status: DC | PRN

## 2018-07-26 MED ORDER — WITCH HAZEL-GLYCERIN EX PADS: 1.0000 "application " | MEDICATED_PAD | CUTANEOUS | Status: DC | PRN

## 2018-07-26 MED ORDER — EPHEDRINE 5 MG/ML INJ: 10.0000 mg | INTRAVENOUS | Status: DC | PRN

## 2018-07-26 MED ORDER — LIDOCAINE HCL (PF) 1 % IJ SOLN: 30.0000 mL | INTRAMUSCULAR | Status: DC | PRN

## 2018-07-26 MED ORDER — SODIUM CHLORIDE (PF) 0.9 % IJ SOLN
INTRAMUSCULAR | Status: DC | PRN
  Administered 2018-07-26: 12 mL/h via EPIDURAL

## 2018-07-26 MED ORDER — LACTATED RINGERS IV SOLN
INTRAVENOUS | Status: DC
  Administered 2018-07-26: 09:00:00 via INTRAVENOUS

## 2018-07-26 MED ORDER — PHENYLEPHRINE 40 MCG/ML (10ML) SYRINGE FOR IV PUSH (FOR BLOOD PRESSURE SUPPORT)
80.0000 ug | PREFILLED_SYRINGE | INTRAVENOUS | Status: DC | PRN
  Filled 2018-07-26: qty 10

## 2018-07-26 MED ORDER — COCONUT OIL OIL: 1.0000 "application " | TOPICAL_OIL | Status: DC | PRN

## 2018-07-26 MED ORDER — FLEET ENEMA 7-19 GM/118ML RE ENEM: 1.0000 | ENEMA | RECTAL | Status: DC | PRN

## 2018-07-26 MED ORDER — ONDANSETRON HCL 4 MG/2ML IJ SOLN: 4.0000 mg | Freq: Four times a day (QID) | INTRAMUSCULAR | Status: DC | PRN

## 2018-07-26 MED ORDER — SOD CITRATE-CITRIC ACID 500-334 MG/5ML PO SOLN: 30.0000 mL | ORAL | Status: DC | PRN

## 2018-07-26 MED ORDER — LACTATED RINGERS IV SOLN: 500.0000 mL | Freq: Once | INTRAVENOUS | Status: DC

## 2018-07-26 MED ORDER — PRENATAL MULTIVITAMIN CH
1.0000 | ORAL_TABLET | Freq: Every day | ORAL | Status: DC
  Administered 2018-07-27: 1 via ORAL
  Filled 2018-07-26: qty 1

## 2018-07-26 MED ORDER — DIPHENHYDRAMINE HCL 50 MG/ML IJ SOLN: 12.5000 mg | INTRAMUSCULAR | Status: DC | PRN

## 2018-07-26 MED ORDER — IBUPROFEN 600 MG PO TABS
600.0000 mg | ORAL_TABLET | Freq: Four times a day (QID) | ORAL | Status: DC
  Administered 2018-07-26 – 2018-07-27 (×4): 600 mg via ORAL
  Filled 2018-07-26 (×4): qty 1

## 2018-07-26 MED ORDER — SENNOSIDES-DOCUSATE SODIUM 8.6-50 MG PO TABS
2.0000 | ORAL_TABLET | ORAL | Status: DC
  Administered 2018-07-27: 2 via ORAL
  Filled 2018-07-26: qty 2

## 2018-07-26 MED ORDER — ONDANSETRON HCL 4 MG PO TABS: 4.0000 mg | ORAL_TABLET | ORAL | Status: DC | PRN

## 2018-07-26 MED ORDER — ZOLPIDEM TARTRATE 5 MG PO TABS: 5.0000 mg | ORAL_TABLET | Freq: Every evening | ORAL | Status: DC | PRN

## 2018-07-26 MED ORDER — BENZOCAINE-MENTHOL 20-0.5 % EX AERO
1.0000 "application " | INHALATION_SPRAY | CUTANEOUS | Status: DC | PRN
  Administered 2018-07-26: 1 via TOPICAL
  Filled 2018-07-26: qty 56

## 2018-07-26 MED ORDER — OXYCODONE-ACETAMINOPHEN 5-325 MG PO TABS: 1.0000 | ORAL_TABLET | ORAL | Status: DC | PRN

## 2018-07-26 MED ORDER — DIPHENHYDRAMINE HCL 25 MG PO CAPS: 25.0000 mg | ORAL_CAPSULE | Freq: Four times a day (QID) | ORAL | Status: DC | PRN

## 2018-07-26 MED ORDER — PHENYLEPHRINE 40 MCG/ML (10ML) SYRINGE FOR IV PUSH (FOR BLOOD PRESSURE SUPPORT): 80.0000 ug | PREFILLED_SYRINGE | INTRAVENOUS | Status: DC | PRN

## 2018-07-26 MED ORDER — LACTATED RINGERS IV SOLN: 500.0000 mL | INTRAVENOUS | Status: DC | PRN

## 2018-07-26 MED ORDER — ACETAMINOPHEN 325 MG PO TABS: 650.0000 mg | ORAL_TABLET | ORAL | Status: DC | PRN

## 2018-07-26 NOTE — Discharge Summary (Signed)
Postpartum Discharge Summary     Patient Name: Dianah FieldMarika Lasha Ruis DOB: [birth year] MRN: [id number]  Date of admission: 07/26/2018 Delivering Provider: Raelyn MoraAWSON, ROLITTA   Date of discharge: 07/27/2018  Admitting diagnosis: Preg Intrauterine pregnancy: 9789w2d     Secondary diagnosis:  Active Problems:   Normal labor  Additional problems: none     Discharge diagnosis: Term Pregnancy Delivered                                                                                                Post partum procedures:none  Augmentation: AROM  Complications: None  Hospital course:  Onset of Labor With Vaginal Delivery     33 y.o. yo G2P1001 at 6589w2d was admitted in Active Labor on 07/26/2018. Patient had an uncomplicated labor course as follows:  Membrane Rupture Time/Date: 7:55 AM ,07/26/2018   Intrapartum Procedures: Episiotomy: None [1]                                         Lacerations:  2nd degree [3];Sulcus [9];Perineal [11]  Patient had a delivery of a Viable infant. 07/26/2018  Information for the patient's newborn:  Dava NajjarLindsay, Nehemiah [[id number]]  Delivery Method: Vag-Spont    Pateint had an uncomplicated postpartum course.  She is ambulating, tolerating a regular diet, passing flatus, and urinating well. Patient is discharged home in stable condition on 07/27/18.   Magnesium Sulfate received: No BMZ received: No  Physical exam  Vitals:   07/26/18 1600 07/26/18 2000 07/27/18 0040 07/27/18 0403  BP: 124/74 118/74 136/88 137/89  Pulse: 91 92 76 78  Resp: 16 18 18 18   Temp: 98 F (36.7 C) 98.1 F (36.7 C) 98.2 F (36.8 C) 98.4 F (36.9 C)  TempSrc: Oral Oral Oral Oral  SpO2:      Weight:       General: alert, cooperative and no distress Lochia: appropriate Uterine Fundus: firm Incision: N/A DVT Evaluation: No evidence of DVT seen on physical exam. No cords or calf tenderness. No significant calf/ankle edema. Labs: Lab Results  Component Value Date   WBC 11.7 (H)  07/26/2018   HGB 10.4 (L) 07/26/2018   HCT 32.7 (L) 07/26/2018   MCV 83.0 07/26/2018   PLT 272 07/26/2018   CMP Latest Ref Rng & Units 04/24/2017  Glucose 70 - 99 mg/dL 79  BUN 6 - 23 mg/dL 10  Creatinine 6.960.40 - 2.951.20 mg/dL 2.840.66  Sodium 132135 - 440145 mEq/L 140  Potassium 3.5 - 5.1 mEq/L 4.3  Chloride 96 - 112 mEq/L 104  CO2 19 - 32 mEq/L 29  Calcium 8.4 - 10.5 mg/dL 10.210.0  Total Protein 6.0 - 8.3 g/dL 7.9  Total Bilirubin 0.2 - 1.2 mg/dL 0.4  Alkaline Phos 39 - 117 U/L 108  AST 0 - 37 U/L 14  ALT 0 - 35 U/L 8    Discharge instruction: per After Visit Summary and "Baby and Me Booklet".  After visit meds:  Allergies as of 07/27/2018   No Known Allergies  Medication List    STOP taking these medications   doxylamine (Sleep) 25 MG tablet Commonly known as:  UNISOM   Lecithin 400 MG Caps   ondansetron 4 MG disintegrating tablet Commonly known as:  Zofran ODT   pyridOXINE 100 MG tablet Commonly known as:  VITAMIN B-6     TAKE these medications   acetaminophen 325 MG tablet Commonly known as:  Tylenol Take 2 tablets (650 mg total) by mouth every 4 (four) hours as needed (for pain scale < 4).   CALCIUM PO Take by mouth.   coconut oil Oil Apply 1 application topically as needed.   Elderberry 575 MG/5ML Syrp Take by mouth.   famotidine 20 MG tablet Commonly known as:  Pepcid Take 1 tablet (20 mg total) by mouth 2 (two) times daily.   FISH OIL PO Take by mouth.   ibuprofen 600 MG tablet Commonly known as:  ADVIL Take 1 tablet (600 mg total) by mouth every 6 (six) hours.   MAGNESIUM PO Take by mouth.   prenatal multivitamin Tabs tablet Take 1 tablet by mouth daily at 12 noon.   PROBIOTIC-10 PO Take by mouth.       Diet: routine diet  Activity: Advance as tolerated. Pelvic rest for 6 weeks.   Outpatient follow up:4 weeks Follow up Appt: Future Appointments  Date Time Provider Department Center  08/16/2018  2:00 PM [facility]  [facility]  09/09/2018  3:10 PM Nugent, Odie Sera, NP [facility] None   Follow up Visit: Follow-up Information    [facility]. Go in 6 week(s).   Specialty:  Obstetrics and Gynecology Why:  Via My Chart Contact information: [address] [place] [address] [phone]           Please schedule this patient for Postpartum visit in: 6 weeks with the following provider: APP For C/S patients schedule nurse incision check in weeks 2 weeks: no Low risk pregnancy complicated by: none Delivery mode:  SVD Anticipated Birth Control:  NFP PP Procedures needed: none  Schedule Integrated BH visit: no - H/O anxiety & depression    Newborn Data: Live born female "Neimiah" Birth Weight:   APGAR: 9, 9  Newborn Delivery   Birth date/time:  07/26/2018 13:03:00 Delivery type:  Vaginal, Spontaneous     Baby Feeding: Breast Disposition:home with mother   07/27/2018 Vonzella Nipple, PA-C

## 2018-07-26 NOTE — MAU Note (Signed)
CTX every 2-3 mins.  No LOF/VB. + FM.  2.5 cm last exam.

## 2018-07-26 NOTE — Anesthesia Preprocedure Evaluation (Signed)
Anesthesia Evaluation  Patient identified by MRN, date of birth, ID band Patient awake    Reviewed: Allergy & Precautions, H&P , NPO status , Patient's Chart, lab work & pertinent test results  Airway Mallampati: II  TM Distance: >3 FB Neck ROM: full    Dental no notable dental hx. (+) Teeth Intact   Pulmonary neg pulmonary ROS,    Pulmonary exam normal breath sounds clear to auscultation       Cardiovascular negative cardio ROS   Rhythm:regular Rate:Normal     Neuro/Psych    GI/Hepatic negative GI ROS, Neg liver ROS,   Endo/Other  negative endocrine ROS  Renal/GU negative Renal ROS  negative genitourinary   Musculoskeletal negative musculoskeletal ROS (+)   Abdominal (+) + obese,   Peds  Hematology  (+) Blood dyscrasia, anemia ,   Anesthesia Other Findings   Reproductive/Obstetrics (+) Pregnancy                             Anesthesia Physical Anesthesia Plan  ASA: II  Anesthesia Plan: Epidural   Post-op Pain Management:    Induction:   PONV Risk Score and Plan:   Airway Management Planned:   Additional Equipment:   Intra-op Plan:   Post-operative Plan:   Informed Consent: I have reviewed the patients History and Physical, chart, labs and discussed the procedure including the risks, benefits and alternatives for the proposed anesthesia with the patient or authorized representative who has indicated his/her understanding and acceptance.       Plan Discussed with:   Anesthesia Plan Comments:         Anesthesia Quick Evaluation

## 2018-07-26 NOTE — H&P (Signed)
LABOR AND DELIVERY ADMISSION HISTORY AND PHYSICAL NOTE  Jean Dickerson is a 33 y.o. female G2P1001 with IUP at 2745w2d by LMP presenting for SOL.  She reports positive fetal movement. She denies leakage of fluid or vaginal bleeding.  Prenatal History/Complications: [facility]  No pregnancy complications   Past Medical History: Past Medical History:  Diagnosis Date  . Allergy   . Anxiety   . Depression     Past Surgical History: Past Surgical History:  Procedure Laterality Date  . WISDOM TOOTH EXTRACTION      Obstetrical History: OB History    Gravida  2   Para  1   Term  1   Preterm      AB      Living  1     SAB      TAB      Ectopic      Multiple  0   Live Births  1           Social History: Social History   Socioeconomic History  . Marital status: Married    Spouse name: Silvestre MesiKierre  . Number of children: 1  . Years of education: masters degree  . Highest education level: Master's degree (e.g., MA, MS, MEng, MEd, MSW, MBA)  Occupational History  . Not on file  Social Needs  . Financial resource strain: Not very hard  . Food insecurity:    Worry: Never true    Inability: Never true  . Transportation needs:    Medical: No    Non-medical: No  Tobacco Use  . Smoking status: Never Smoker  . Smokeless tobacco: Never Used  Substance and Sexual Activity  . Alcohol use: Not Currently    Alcohol/week: 4.0 standard drinks    Types: 2 Glasses of wine, 2 Cans of beer per week  . Drug use: Never  . Sexual activity: Yes    Birth control/protection: None  Lifestyle  . Physical activity:    Days per week: 0 days    Minutes per session: 0 min  . Stress: Only a little  Relationships  . Social connections:    Talks on phone: Three times a week    Gets together: Once a week    Attends religious service: More than 4 times per year    Active member of club or organization: Yes    Attends meetings of clubs or organizations: More than 4 times  per year    Relationship status: Married  Other Topics Concern  . Not on file  Social History Narrative  . Not on file    Family History: Family History  Problem Relation Age of Onset  . Hypertension Father   . Mental illness Father   . Cancer Maternal Grandmother   . Liver disease Maternal Grandfather   . Emphysema Paternal Grandmother   . Lung cancer Paternal Grandfather   . Hypertension Mother   . Hyperlipidemia Mother   . Breast cancer Paternal Aunt     Allergies: No Known Allergies  Medications Prior to Admission  Medication Sig Dispense Refill Last Dose  . acetaminophen (TYLENOL) 325 MG tablet Take 2 tablets (650 mg total) by mouth every 4 (four) hours as needed (for pain scale < 4).   Taking  . CALCIUM PO Take by mouth.   Taking  . coconut oil OIL Apply 1 application topically as needed. (Patient not taking: Reported on 04/08/2018)  0 Not Taking  . doxylamine, Sleep, (UNISOM) 25  MG tablet Take 25 mg by mouth at bedtime as needed.   Taking  . Elderberry 575 MG/5ML SYRP Take by mouth.   Taking  . famotidine (PEPCID) 20 MG tablet Take 1 tablet (20 mg total) by mouth 2 (two) times daily. 60 tablet 3   . Lecithin 400 MG CAPS Take by mouth.   Not Taking  . MAGNESIUM PO Take by mouth.   Taking  . Omega-3 Fatty Acids (FISH OIL PO) Take by mouth.   Taking  . ondansetron (ZOFRAN ODT) 4 MG disintegrating tablet Take 1 tablet (4 mg total) by mouth every 8 (eight) hours as needed for nausea or vomiting. (Patient not taking: Reported on 04/08/2018) 20 tablet 0 Not Taking  . Prenatal Vit-Fe Fumarate-FA (PRENATAL MULTIVITAMIN) TABS tablet Take 1 tablet by mouth daily at 12 noon.   Taking  . Probiotic Product (PROBIOTIC-10 PO) Take by mouth.   Taking  . pyridOXINE (VITAMIN B-6) 100 MG tablet Take 50 mg by mouth at bedtime and may repeat dose one time if needed.   Taking     Review of Systems  All systems reviewed and negative except as stated in HPI  Physical Exam Blood pressure  129/84, pulse 99, temperature (!) 97.5 F (36.4 C), temperature source Oral, resp. rate 18, weight 87.7 kg, last menstrual period 10/31/2017, SpO2 100 %, currently breastfeeding. General appearance: alert, cooperative and no distress Lungs: clear to auscultation bilaterally Heart: regular rate and rhythm Abdomen: soft, non-tender; bowel sounds normal Extremities: No calf swelling or tenderness Presentation: cephalic Fetal monitoring: 135/ moderate/ +accels/ no decelerations  Uterine activity: 2-5/ moderate by palpation  Dilation: 5.5 Effacement (%): 80 Station: -1 Exam by:: Suezanne Jacquet, RN  Prenatal labs: ABO, Rh: A/Positive/-- (11/18 1019) Antibody: Negative (11/18 1019) Rubella: 1.90 (11/18 1019) RPR: Non Reactive (03/18 0834)  HBsAg: Negative (11/18 1019)  HIV: Non Reactive (03/18 0834)  GC/Chlamydia: negative  GBS: Negative (05/21 0000)  2 hr Glucola: [phone] Genetic screening:  normal Anatomy US: Normal female   Nursing Staff Provider  Office Location  [facility] Dating  LMP  Language  English Anatomy US  Normal  Flu Vaccine   Declined 02/11/18 Genetic Screen  NIPS: Normal  TDaP vaccine   Declined Hgb A1C or  GTT Third trimester - Normal  Rhogam  N/A   LAB RESULTS   Feeding Plan Breast Blood Type A/Positive/-- (11/18 1019)   Contraception Family Planning Antibody Negative (11/18 1019)  Circumcision If boy, yes Rubella 1.90 (11/18 1019)  Pediatrician  Undecided RPR Non Reactive (11/18 1019)   Support Person Kierre HBsAg Negative (11/18 1019)   Prenatal Classes No HIV Non Reactive (11/18 1019)  BTL Consent  N/A GBS  (For PCN allergy, check sensitivities)   VBAC Consent N/A Pap Normal    Hgb Electro  Negative    CF Negative    SMA Negative    Waterbirth  [x]  Class (virtual) [ ]  Consent [ ]  CNM visit   Prenatal Transfer Tool  Maternal Diabetes: No Genetic Screening: Normal Maternal Ultrasounds/Referrals: Normal Fetal Ultrasounds or other Referrals:   None Maternal Substance Abuse:  No Significant Maternal Medications:  None Significant Maternal Lab Results: Lab values include: Group B Strep negative  Results for orders placed or performed during the hospital encounter of 07/26/18 (from the past 24 hour(s))  CBC   Collection Time: 07/26/18  6:40 AM  Result Value Ref Range   WBC 11.7 (H) 4.0 - 10.5 K/uL   RBC 3.94 3.87 -  5.11 MIL/uL   Hemoglobin 10.4 (L) 12.0 - 15.0 g/dL   HCT 11.9 (L) 14.7 - 82.9 %   MCV 83.0 80.0 - 100.0 fL   MCH 26.4 26.0 - 34.0 pg   MCHC 31.8 30.0 - 36.0 g/dL   RDW 56.2 13.0 - 86.5 %   Platelets 272 150 - 400 K/uL   nRBC 0.0 0.0 - 0.2 %    Patient Active Problem List   Diagnosis Date Noted  . Normal labor 07/26/2018  . Supervision of other normal pregnancy, antepartum 01/11/2018  . Migraine without aura 11/04/2015  . Acne 02/15/2015    Assessment: Jean Dickerson is a 33 y.o. G2P1001 at [redacted]w[redacted]d here for SOL   #Labor: Expectant management  #Pain: Epidural  #FWB: Cat I  #ID:  GBS neg  #MOF: Breast  #MOC:NFP #Circ:  Outpatient   Sharyon Cable, CNM 07/26/2018, 7:56 AM

## 2018-07-26 NOTE — Anesthesia Procedure Notes (Signed)
Epidural Patient location during procedure: OB Start time: 07/26/2018 7:38 AM End time: 07/26/2018 7:42 AM  Staffing Anesthesiologist: Leilani Able, MD Performed: anesthesiologist   Preanesthetic Checklist Completed: patient identified, site marked, surgical consent, pre-op evaluation, timeout performed, IV checked, risks and benefits discussed and monitors and equipment checked  Epidural Patient position: sitting Prep: site prepped and draped and DuraPrep Patient monitoring: continuous pulse ox and blood pressure Approach: midline Location: L3-L4 Injection technique: LOR air  Needle:  Needle type: Tuohy  Needle gauge: 17 G Needle length: 9 cm and 9 Needle insertion depth: 6 cm Catheter type: closed end flexible Catheter size: 19 Gauge Catheter at skin depth: 11 cm Test dose: negative and Other  Assessment Sensory level: T9 Events: blood not aspirated, injection not painful, no injection resistance, negative IV test and no paresthesia  Additional Notes Reason for block:procedure for pain

## 2018-07-26 NOTE — Progress Notes (Deleted)
LABOR AND DELIVERY ADMISSION HISTORY AND PHYSICAL NOTE  Jean Dickerson is a 33 y.o. female G2P1001 with IUP at 2745w2d by LMP presenting for SOL.  She reports positive fetal movement. She denies leakage of fluid or vaginal bleeding.  Prenatal History/Complications: [facility]  No pregnancy complications   Past Medical History: Past Medical History:  Diagnosis Date  . Allergy   . Anxiety   . Depression     Past Surgical History: Past Surgical History:  Procedure Laterality Date  . WISDOM TOOTH EXTRACTION      Obstetrical History: OB History    Gravida  2   Para  1   Term  1   Preterm      AB      Living  1     SAB      TAB      Ectopic      Multiple  0   Live Births  1           Social History: Social History   Socioeconomic History  . Marital status: Married    Spouse name: Silvestre MesiKierre  . Number of children: 1  . Years of education: masters degree  . Highest education level: Master's degree (e.g., MA, MS, MEng, MEd, MSW, MBA)  Occupational History  . Not on file  Social Needs  . Financial resource strain: Not very hard  . Food insecurity:    Worry: Never true    Inability: Never true  . Transportation needs:    Medical: No    Non-medical: No  Tobacco Use  . Smoking status: Never Smoker  . Smokeless tobacco: Never Used  Substance and Sexual Activity  . Alcohol use: Not Currently    Alcohol/week: 4.0 standard drinks    Types: 2 Glasses of wine, 2 Cans of beer per week  . Drug use: Never  . Sexual activity: Yes    Birth control/protection: None  Lifestyle  . Physical activity:    Days per week: 0 days    Minutes per session: 0 min  . Stress: Only a little  Relationships  . Social connections:    Talks on phone: Three times a week    Gets together: Once a week    Attends religious service: More than 4 times per year    Active member of club or organization: Yes    Attends meetings of clubs or organizations: More than 4 times  per year    Relationship status: Married  Other Topics Concern  . Not on file  Social History Narrative  . Not on file    Family History: Family History  Problem Relation Age of Onset  . Hypertension Father   . Mental illness Father   . Cancer Maternal Grandmother   . Liver disease Maternal Grandfather   . Emphysema Paternal Grandmother   . Lung cancer Paternal Grandfather   . Hypertension Mother   . Hyperlipidemia Mother   . Breast cancer Paternal Aunt     Allergies: No Known Allergies  Medications Prior to Admission  Medication Sig Dispense Refill Last Dose  . acetaminophen (TYLENOL) 325 MG tablet Take 2 tablets (650 mg total) by mouth every 4 (four) hours as needed (for pain scale < 4).   Taking  . CALCIUM PO Take by mouth.   Taking  . coconut oil OIL Apply 1 application topically as needed. (Patient not taking: Reported on 04/08/2018)  0 Not Taking  . doxylamine, Sleep, (UNISOM) 25  MG tablet Take 25 mg by mouth at bedtime as needed.   Taking  . Elderberry 575 MG/5ML SYRP Take by mouth.   Taking  . famotidine (PEPCID) 20 MG tablet Take 1 tablet (20 mg total) by mouth 2 (two) times daily. 60 tablet 3   . Lecithin 400 MG CAPS Take by mouth.   Not Taking  . MAGNESIUM PO Take by mouth.   Taking  . Omega-3 Fatty Acids (FISH OIL PO) Take by mouth.   Taking  . ondansetron (ZOFRAN ODT) 4 MG disintegrating tablet Take 1 tablet (4 mg total) by mouth every 8 (eight) hours as needed for nausea or vomiting. (Patient not taking: Reported on 04/08/2018) 20 tablet 0 Not Taking  . Prenatal Vit-Fe Fumarate-FA (PRENATAL MULTIVITAMIN) TABS tablet Take 1 tablet by mouth daily at 12 noon.   Taking  . Probiotic Product (PROBIOTIC-10 PO) Take by mouth.   Taking  . pyridOXINE (VITAMIN B-6) 100 MG tablet Take 50 mg by mouth at bedtime and may repeat dose one time if needed.   Taking     Review of Systems  All systems reviewed and negative except as stated in HPI  Physical Exam Blood pressure  129/84, pulse 99, temperature (!) 97.5 F (36.4 C), temperature source Oral, resp. rate 18, weight 87.7 kg, last menstrual period 10/31/2017, SpO2 100 %, currently breastfeeding. General appearance: alert, cooperative and no distress Lungs: clear to auscultation bilaterally Heart: regular rate and rhythm Abdomen: soft, non-tender; bowel sounds normal Extremities: No calf swelling or tenderness Presentation: cephalic Fetal monitoring: 135/ moderate/ +accels/ no decelerations  Uterine activity: 2-5/ moderate by palpation  Dilation: 5.5 Effacement (%): 80 Station: -1 Exam by:: Suezanne Jacquet, RN  Prenatal labs: ABO, Rh: A/Positive/-- (11/18 1019) Antibody: Negative (11/18 1019) Rubella: 1.90 (11/18 1019) RPR: Non Reactive (03/18 0834)  HBsAg: Negative (11/18 1019)  HIV: Non Reactive (03/18 0834)  GC/Chlamydia: negative  GBS: Negative (05/21 0000)  2 hr Glucola: [phone] Genetic screening:  normal Anatomy US: Normal female   Nursing Staff Provider  Office Location  [facility] Dating  LMP  Language  English Anatomy US  Normal  Flu Vaccine   Declined 02/11/18 Genetic Screen  NIPS: Normal  TDaP vaccine   Declined Hgb A1C or  GTT Third trimester - Normal  Rhogam  N/A   LAB RESULTS   Feeding Plan Breast Blood Type A/Positive/-- (11/18 1019)   Contraception Family Planning Antibody Negative (11/18 1019)  Circumcision If boy, yes Rubella 1.90 (11/18 1019)  Pediatrician  Undecided RPR Non Reactive (11/18 1019)   Support Person Kierre HBsAg Negative (11/18 1019)   Prenatal Classes No HIV Non Reactive (11/18 1019)  BTL Consent  N/A GBS  (For PCN allergy, check sensitivities)   VBAC Consent N/A Pap Normal    Hgb Electro  Negative    CF Negative    SMA Negative    Waterbirth  [x]  Class (virtual) [ ]  Consent [ ]  CNM visit   Prenatal Transfer Tool  Maternal Diabetes: No Genetic Screening: Normal Maternal Ultrasounds/Referrals: Normal Fetal Ultrasounds or other Referrals:   None Maternal Substance Abuse:  No Significant Maternal Medications:  None Significant Maternal Lab Results: Lab values include: Group B Strep negative  Results for orders placed or performed during the hospital encounter of 07/26/18 (from the past 24 hour(s))  CBC   Collection Time: 07/26/18  6:40 AM  Result Value Ref Range   WBC 11.7 (H) 4.0 - 10.5 K/uL   RBC 3.94 3.87 -  5.11 MIL/uL   Hemoglobin 10.4 (L) 12.0 - 15.0 g/dL   HCT 32.7 (L) 36.0 - 46.0 %   MCV 83.0 80.0 - 100.0 fL   MCH 26.4 26.0 - 34.0 pg   MCHC 31.8 30.0 - 36.0 g/dL   RDW 14.5 11.5 - 15.5 %   Platelets 272 150 - 400 K/uL   nRBC 0.0 0.0 - 0.2 %    Patient Active Problem List   Diagnosis Date Noted  . Normal labor 07/26/2018  . Supervision of other normal pregnancy, antepartum 01/11/2018  . Migraine without aura 11/04/2015  . Acne 02/15/2015    Assessment: Jean Dickerson is a 32 y.o. G2P1001 at [redacted]w[redacted]d here for SOL   #Labor: Expectant management  #Pain: Epidural  #FWB: Cat I  #ID:  GBS neg  #MOF: Breast  #MOC:NFP #Circ:  Outpatient   Nasser Ku C, CNM 07/26/2018, 7:56 AM  

## 2018-07-26 NOTE — Progress Notes (Signed)
OB/[facility]N [facility]: Labor Progress Note  Subjective: Late entry because of other urgent patient care needs on unit. Strip note, plan of care discussed with RN.  Objective: BP 116/60   Pulse 76   Temp 98.6 F (37 C) (Oral)   Resp 16   Wt 87.7 kg   LMP 10/31/2017 (Exact Date)   SpO2 98%   BMI 34.23 kg/m  Gen: strip note  Dilation: 8.5 Effacement (%): 80 Station: -1 Presentation: Vertex  Assessment and Plan: 33 y.o. G2P1001 [redacted]w[redacted]d here with SOL.  Labor: SOL, progressing well. SROM scan fluid earlier but still with BBOW on exam per RN. Epidural now in place. Discussed with RN that CNM in MAU hopefully available for delivery, otherwise I can scrub out of OR.  -- pain control: epidural -- PPH Risk: low  Fetal Well-Being:  Cephalic by sutures on RN check.  -- Category I - continuous fetal monitoring  -- GBS negative    Jean Mose S. Earlene Plater, DO OB/GYN Fellow, Faculty Practice  1:06 PM

## 2018-07-26 NOTE — Anesthesia Postprocedure Evaluation (Signed)
Anesthesia Post Note  Patient: Jean Dickerson  Procedure(s) Performed: AN AD HOC LABOR EPIDURAL     Patient location during evaluation: Mother Baby Anesthesia Type: Epidural Level of consciousness: awake and alert Pain management: pain level controlled Vital Signs Assessment: post-procedure vital signs reviewed and stable Respiratory status: spontaneous breathing, nonlabored ventilation and respiratory function stable Cardiovascular status: stable Postop Assessment: no headache, no backache and epidural receding Anesthetic complications: no    Last Vitals:  Vitals:   07/26/18 1500 07/26/18 1600  BP: 117/70 124/74  Pulse: (!) 113 91  Resp: 14 16  Temp: (!) 36.3 C 36.7 C  SpO2: 98%     Last Pain:  Vitals:   07/26/18 1720  TempSrc:   PainSc: 2    Pain Goal:                   [name]

## 2018-07-27 MED ORDER — IBUPROFEN 600 MG PO TABS: 600.0000 mg | ORAL_TABLET | Freq: Four times a day (QID) | ORAL | 0 refills | Status: DC

## 2018-07-27 NOTE — Lactation Note (Signed)
This note was copied from a baby's chart. Lactation Consultation Note  Patient Name: Jean Dickerson [id number] Date: 07/27/2018  P2, 11 hour female infant. LC entered the room and mom and infant asleep.    Maternal Data    Feeding Feeding Type: Breast Fed  LATCH Score Latch: Repeated attempts needed to sustain latch, nipple held in mouth throughout feeding, stimulation needed to elicit sucking reflex.  Audible Swallowing: A few with stimulation  Type of Nipple: Everted at rest and after stimulation  Comfort (Breast/Nipple): Soft / non-tender  Hold (Positioning): No assistance needed to correctly position infant at breast.  LATCH Score: 8  Interventions Interventions: Breast feeding basics reviewed;Skin to skin;DEBP  Lactation Tools Discussed/Used     Consult Status      Danelle Earthly 07/27/2018, 12:30 AM

## 2018-07-27 NOTE — Discharge Instructions (Signed)
Vaginal Delivery, Care After °Refer to this sheet in the next few weeks. These instructions provide you with information about caring for yourself after vaginal delivery. Your health care provider may also give you more specific instructions. Your treatment has been planned according to current medical practices, but problems sometimes occur. Call your health care provider if you have any problems or questions. °What can I expect after the procedure? °After vaginal delivery, it is common to have: °· Some bleeding from your vagina. °· Soreness in your abdomen, your vagina, and the area of skin between your vaginal opening and your anus (perineum). °· Pelvic cramps. °· Fatigue. °Follow these instructions at home: °Medicines °· Take over-the-counter and prescription medicines only as told by your health care provider. °· If you were prescribed an antibiotic medicine, take it as told by your health care provider. Do not stop taking the antibiotic until it is finished. °Driving ° °· Do not drive or operate heavy machinery while taking prescription pain medicine. °· Do not drive for 24 hours if you received a sedative. °Lifestyle °· Do not drink alcohol. This is especially important if you are breastfeeding or taking medicine to relieve pain. °· Do not use tobacco products, including cigarettes, chewing tobacco, or e-cigarettes. If you need help quitting, ask your health care provider. °Eating and drinking °· Drink at least 8 eight-ounce glasses of water every day unless you are told not to by your health care provider. If you choose to breastfeed your baby, you may need to drink more water than this. °· Eat high-fiber foods every day. These foods may help prevent or relieve constipation. High-fiber foods include: °? Whole grain cereals and breads. °? Brown rice. °? Beans. °? Fresh fruits and vegetables. °Activity °· Return to your normal activities as told by your health care provider. Ask your health care provider what  activities are safe for you. °· Rest as much as possible. Try to rest or take a nap when your baby is sleeping. °· Do not lift anything that is heavier than your baby or 10 lb (4.5 kg) until your health care provider says that it is safe. °· Talk with your health care provider about when you can engage in sexual activity. This may depend on your: °? Risk of infection. °? Rate of healing. °? Comfort and desire to engage in sexual activity. °Vaginal Care °· If you have an episiotomy or a vaginal tear, check the area every day for signs of infection. Check for: °? More redness, swelling, or pain. °? More fluid or blood. °? Warmth. °? Pus or a bad smell. °· Do not use tampons or douches until your health care provider says this is safe. °· Watch for any blood clots that may pass from your vagina. These may look like clumps of dark red, brown, or black discharge. °General instructions °· Keep your perineum clean and dry as told by your health care provider. °· Wear loose, comfortable clothing. °· Wipe from front to back when you use the toilet. °· Ask your health care provider if you can shower or take a bath. If you had an episiotomy or a perineal tear during labor and delivery, your health care provider may tell you not to take baths for a certain length of time. °· Wear a bra that supports your breasts and fits you well. °· If possible, have someone help you with household activities and help care for your baby for at least a few days after you   leave the hospital. °· Keep all follow-up visits for you and your baby as told by your health care provider. This is important. °Contact a health care provider if: °· You have: °? Vaginal discharge that has a bad smell. °? Difficulty urinating. °? Pain when urinating. °? A sudden increase or decrease in the frequency of your bowel movements. °? More redness, swelling, or pain around your episiotomy or vaginal tear. °? More fluid or blood coming from your episiotomy or vaginal  tear. °? Pus or a bad smell coming from your episiotomy or vaginal tear. °? A fever. °? A rash. °? Little or no interest in activities you used to enjoy. °? Questions about caring for yourself or your baby. °· Your episiotomy or vaginal tear feels warm to the touch. °· Your episiotomy or vaginal tear is separating or does not appear to be healing. °· Your breasts are painful, hard, or turn red. °· You feel unusually sad or worried. °· You feel nauseous or you vomit. °· You pass large blood clots from your vagina. If you pass a blood clot from your vagina, save it to show to your health care provider. Do not flush blood clots down the toilet without having your health care provider look at them. °· You urinate more than usual. °· You are dizzy or light-headed. °· You have not breastfed at all and you have not had a menstrual period for 12 weeks after delivery. °· You have stopped breastfeeding and you have not had a menstrual period for 12 weeks after you stopped breastfeeding. °Get help right away if: °· You have: °? Pain that does not go away or does not get better with medicine. °? Chest pain. °? Difficulty breathing. °? Blurred vision or spots in your vision. °? Thoughts about hurting yourself or your baby. °· You develop pain in your abdomen or in one of your legs. °· You develop a severe headache. °· You faint. °· You bleed from your vagina so much that you fill two sanitary pads in one hour. °This information is not intended to replace advice given to you by your health care provider. Make sure you discuss any questions you have with your health care provider. °Document Released: 02/08/2000 Document Revised: 07/25/2015 Document Reviewed: 02/25/2015 °Elsevier Interactive Patient Education © 2019 [employer] ° °

## 2018-07-28 ENCOUNTER — Other Ambulatory Visit: Payer: Self-pay | Admitting: Obstetrics and Gynecology

## 2018-07-28 DIAGNOSIS — Z8619 Personal history of other infectious and parasitic diseases: Secondary | ICD-10-CM | POA: Insufficient documentation

## 2018-07-28 MED ORDER — FLUCONAZOLE 200 MG PO TABS: 200.0000 mg | ORAL_TABLET | Freq: Every day | ORAL | 0 refills | Status: DC

## 2018-07-28 NOTE — Progress Notes (Signed)
Pt request Diflucan for thrush as advised by her personal Advertising copywriter. She reports having sx's of thrush starting. She has a h/o thrush and does not want it to "get that bad."  [name], CNM

## 2018-07-29 ENCOUNTER — Encounter: Payer: 59 | Admitting: Obstetrics and Gynecology

## 2018-08-05 ENCOUNTER — Encounter: Payer: 59 | Admitting: Obstetrics and Gynecology

## 2018-08-12 ENCOUNTER — Encounter: Payer: Self-pay | Admitting: General Practice

## 2018-08-12 NOTE — BH Specialist Note (Deleted)
Integrated Behavioral Health Visit via Telemedicine (Telephone)  08/12/2018 Jean Dickerson [id number]   Session Start time: ***  Session End time: *** Total time: {IBH Total JKKX:[id number]}  Referring Provider: Laury Deep, CNM for history of anxiety and depression Type of Visit: Telephonic Patient location: *** [facility] Provider location: *** All persons participating in visit: ***  Confirmed patient's address: {YES/NO:[address]} Confirmed patient's phone number: {YES/NO:21197} Any changes to demographics: {YES/NO:21197}  Confirmed patient's insurance: {YES/NO:21197} Any changes to patient's insurance: {YES/NO:21197}  Discussed confidentiality: {YES/NO:21197}   The following statements were read to the patient and/or legal guardian that are established with the [facility] Provider.  "The purpose of this phone visit is to provide behavioral health care while limiting exposure to the coronavirus (COVID19).  There is a possibility of technology failure and discussed alternative modes of communication if that failure occurs."  "By engaging in this telephone visit, you consent to the provision of healthcare.  Additionally, you authorize for your insurance to be billed for the services provided during this telephone visit."   Patient and/or legal guardian consented to telephone visit: {YES/NO:21197}  PRESENTING CONCERNS: Patient and/or family reports the following symptoms/concerns: *** Duration of problem: ***; Severity of problem: {Mild/Moderate/Severe:20260}  STRENGTHS (Protective Factors/Coping Skills): ***  GOALS ADDRESSED: Patient will: 1.  Reduce symptoms of: {IBH Symptoms:21014056}  2.  Increase knowledge and/or ability of: {IBH Patient Tools:21014057}  3.  Demonstrate ability to: {IBH Goals:21014053}  INTERVENTIONS: Interventions utilized:  {IBH Interventions:21014054} Standardized Assessments completed: {IBH Screening Tools:21014051}  ASSESSMENT: Patient  currently experiencing ***.   Patient may benefit from ***.  PLAN: 1. Follow up with behavioral health clinician on : *** 2. Behavioral recommendations: *** 3. Referral(s): {IBH Referrals:21014055}  Roselyn Reef C McMannes

## 2018-08-16 ENCOUNTER — Other Ambulatory Visit: Payer: Self-pay

## 2018-08-16 ENCOUNTER — Institutional Professional Consult (permissible substitution): Payer: 59

## 2018-09-09 ENCOUNTER — Ambulatory Visit (INDEPENDENT_AMBULATORY_CARE_PROVIDER_SITE_OTHER): Payer: 59 | Admitting: Women's Health

## 2018-09-09 ENCOUNTER — Encounter: Payer: Self-pay | Admitting: Women's Health

## 2018-09-09 ENCOUNTER — Other Ambulatory Visit: Payer: Self-pay

## 2018-09-09 NOTE — Progress Notes (Signed)
Post Partum Exam  Jean Dickerson is a 33 y.o. G95P2002 female who presents for a postpartum visit. She is 6 weeks postpartum following a spontaneous vaginal delivery. I have fully reviewed the prenatal and intrapartum course. The delivery was at [redacted]w[redacted]d gestational weeks.  Anesthesia: epidural. Postpartum course has been uncomplicated. Baby's course has been uncomplicated. Baby is feeding by breast. Bleeding no bleeding. Bowel function is normal. Bladder function is normal. Patient is not sexually active. Contraception method is natural family planning, considering vasectomy. Postpartum depression screening:neg (score 7), pt feels supported by husband and mother who lives with her. Denies getting enough sleep, has 32 year old at home also.  The following portions of the patient's history were reviewed and updated as appropriate: allergies, current medications, past family history, past medical history, past social history, past surgical history and problem list. Last pap smear done 01/2018 and was NILM, HPV negative,  Review of Systems Pertinent items noted in HPI and remainder of comprehensive ROS otherwise negative.    Objective:  Blood pressure 123/74, pulse 84, temperature 98.4 F (36.9 C), temperature source Oral, weight 168 lb (76.2 kg), currently breastfeeding.  General:  alert and cooperative   Breasts:  inspection negative, no nipple discharge or bleeding, no masses or nodularity palpable  Lungs: clear to auscultation bilaterally  Heart:  regular rate and rhythm, S1, S2 normal, no murmur, click, rub or gallop  Abdomen: soft, non-tender; bowel sounds normal; no masses,  no organomegaly   Vulva:  normal  Vagina: normal vagina, no discharge, exudate, lesion, or erythema  Cervix:  no cervical motion tenderness and no lesions  Corpus: normal size, contour, position, consistency, mobility, non-tender  Adnexa:  normal adnexa and no mass, fullness, tenderness  Rectal Exam: Not performed.         Assessment:    Normal postpartum exam. Pap smear not done at today's visit.   Plan:   1. Postpartum exam  1. Contraception: rhythm method, discussed LAM and difficulties of using natural family planning while breastfeeding and with irregular periods. 2. Discussed s/sx of Baby Blues and PP Depression and when to seek help 3. Follow up in: 1 year or as needed.

## 2018-09-09 NOTE — Patient Instructions (Signed)
Postpartum Baby Blues The postpartum period begins right after the birth of a baby. During this time, there is often a lot of joy and excitement. It is also a time of many changes in the life of the parents. No matter how many times a mother gives birth, each child brings new challenges to the family, including different ways of relating to one another. It is common to have feelings of excitement along with confusing changes in moods, emotions, and thoughts. You may feel happy one minute and sad or stressed the next. These feelings of sadness usually happen in the period right after you have your baby, and they go away within a week or two. This is called the "baby blues." What are the causes? There is no known cause of baby blues. It is likely caused by a combination of factors. However, changes in hormone levels after childbirth are believed to trigger some of the symptoms. Other factors that can play a role in these mood changes include:  Lack of sleep.  Stressful life events, such as poverty, caring for a loved one, or death of a loved one.  Genetics. What are the signs or symptoms? Symptoms of this condition include:  Brief changes in mood, such as going from extreme happiness to sadness.  Decreased concentration.  Difficulty sleeping.  Crying spells and tearfulness.  Loss of appetite.  Irritability.  Anxiety. If the symptoms of baby blues last for more than 2 weeks or become more severe, you may have postpartum depression. How is this diagnosed? This condition is diagnosed based on an evaluation of your symptoms. There are no medical or lab tests that lead to a diagnosis, but there are various questionnaires that a health care provider may use to identify women with the baby blues or postpartum depression. How is this treated? Treatment is not needed for this condition. The baby blues usually go away on their own in 1-2 weeks. Social support is often all that is needed. You will  be encouraged to get adequate sleep and rest. Follow these instructions at home: Lifestyle      Get as much rest as you can. Take a nap when the baby sleeps.  Exercise regularly as told by your health care provider. Some women find yoga and walking to be helpful.  Eat a balanced and nourishing diet. This includes plenty of fruits and vegetables, whole grains, and lean proteins.  Do little things that you enjoy. Have a cup of tea, take a bubble bath, read your favorite magazine, or listen to your favorite music.  Avoid alcohol.  Ask for help with household chores, cooking, grocery shopping, or running errands. Do not try to do everything yourself. Consider hiring a postpartum doula to help. This is a professional who specializes in providing support to new mothers.  Try not to make any major life changes during pregnancy or right after giving birth. This can add stress. General instructions  Talk to people close to you about how you are feeling. Get support from your partner, family members, friends, or other new moms. You may want to join a support group.  Find ways to cope with stress. This may include: ? Writing your thoughts and feelings in a journal. ? Spending time outside. ? Spending time with people who make you laugh.  Try to stay positive in how you think. Think about the things you are grateful for.  Take over-the-counter and prescription medicines only as told by your health care provider.    Let your health care provider know if you have any concerns.  Keep all postpartum visits as told by your health care provider. This is important. Contact a health care provider if:  Your baby blues do not go away after 2 weeks. Get help right away if:  You have thoughts of taking your own life (suicidal thoughts).  You think you may harm the baby or other people.  You see or hear things that are not there (hallucinations). Summary  After giving birth, you may feel happy  one minute and sad or stressed the next. Feelings of sadness that happen right after the baby is born and go away after a week or two are called the "baby blues."  You can manage the baby blues by getting enough rest, eating a healthy diet, exercising, spending time with supportive people, and finding ways to cope with stress.  If feelings of sadness and stress last longer than 2 weeks or get in the way of caring for your baby, talk to your health care provider. This may mean you have postpartum depression. This information is not intended to replace advice given to you by your health care provider. Make sure you discuss any questions you have with your health care provider. Document Released: 11/15/2003 Document Revised: 06/04/2018 Document Reviewed: 04/08/2016 Elsevier Patient Education  2020 [place]  Natural Family Planning (NFP) is a type of birth control (contraception) in which no form of contraceptive medicine or device is used. The NFP method relies on knowing which days of the month a woman's ovary is producing an egg (ovulation). Ovulation is the time in the menstrual cycle when a woman is most fertile and, therefore, most likely to become pregnant. To lower the chance of pregnancy, sex is avoided during ovulation. NFP is a safe method of birth control and can prevent pregnancy if it is done correctly. However, NFP does not provide protection from sexually transmitted diseases. NFP can also be used as a method of getting pregnant, by deciding to have sex during ovulation. How does the NFP method work? NFP works by making both sexual partners aware of how the woman's body functions during her menstrual cycle.  Usually, a woman has a menstrual period every 28-30 days. However, there can be 23-35 days between each menstrual period. This varies for each woman. A woman with a 28-day menstrual cycle has about 6 days a month when she is most likely to get  pregnant.  Ovulation happens 12-14 days before the start of the next menstrual period. There are different methods that are used to determine when ovulation starts.  An egg is fertile for 24 hours after it is released from the ovary. Sperm can live for 3 days or more. What NFP methods can be used to prevent pregnancy? The basal body temperature method During ovulation, there is often a slight increase in body temperature. To use this method:  Take your temperature every morning before getting out of bed. Write the temperature on a chart.  Do not have sex from the day the menstrual periods starts until 3 days after the increase in temperature. Note that body temperature may increase as a result of various factors, including fever, restless sleep, and working schedules. The cervical mucus method Right before ovulation, mucus from the lower part of the uterus (cervix) changes from dry and sticky to wet and slippery. To use this method:  Check the mucus every day to look for these changes. Ovulation happens  on the last day of wet, slippery mucus.  Do not have sex starting when you first see wet, slippery mucus and until 4 days after it stops or returns to its normal consistency.  With this method, it is safe to have sex: ? After the 4 days have passed, and until 10 days after the menstrual period starts. ? On days when mucus is dry. Note that cervical mucus can increase or change consistency due to reasons other than ovulation, such as infection, lubricants, some medicines, and sexual arousal. Other variations of the cervical mucus method include the TwoDay method, [place] ovulation method, and the [employer] model. The symptothermal method This method combines the basal body temperature and the cervical mucus methods. The calendar method This method involves tracking menstrual cycles to determine when ovulation occurs. This method is helpful when the menstrual cycle varies in length. To use  this method:  For 6 months, record when menstrual periods start and end and the length of each menstrual cycle. The length of a menstrual cycle is from day 1 of the present menstrual period to day 1 of the next menstrual period.  Use this information to determine when you will likely ovulate. Avoid sex during that time. You may need help from your health care provider to determine which days you are most likely to get pregnant. ? Ovulation usually happens 12-14 days before the start of the next menstrual period. ? Light vaginal bleeding (spotting) or abdominal cramps during the middle of a menstrual cycle may be signs of ovulation. However, not all women have these symptoms. The standard days method This method is based on studies of women's hormone levels throughout normal menstrual cycles. Based on these studies, a standard rule was developed to predict when women are most fertile during their menstrual cycle. According to the rule, if your cycle is 26-32 days long, you are most fertile between days 8 and 19. To prevent pregnancy, you should avoid having sex during this time, or use a barrier method of birth control. This method works best if your cycle is regularly between 26-32 days long. When should I not use the NFP method? You should not use NFP if:  You have very irregular menstrual periods or you sometimes skip a menstrual period.  You have abnormal vaginal bleeding.  You recently had a baby or are breastfeeding.  You have a vaginal or cervical infection.  You take medicines that can affect vaginal mucus or body temperature. These medicines include antibiotics, thyroid medicines, and antihistamines that are found in some cold and allergy medicines.  You absolutely do not want to become pregnant at the current time. Other methods of contraception are more effective at preventing pregnancy than NFP.  You are concerned about sexually transmitted diseases. Summary  Natural Family  Planning methods help women and their partners to understand how to avoid pregnancy without using medicines or other methods.  A woman learns to recognize her most fertile days. A woman with a 28 day menstrual cycle has about 6 days per month when she can get pregnant. This information is not intended to replace advice given to you by your health care provider. Make sure you discuss any questions you have with your health care provider. Document Released: 07/30/2007 Document Revised: 06/04/2018 Document Reviewed: 03/31/2016 Elsevier Patient Education  2020 Else[employer] Inc[employer] Mastitis  Mastitis is inflammation of the breast tissue. It occurs most often in women who are breastfeeding, but it can also affect other women, and sometimes  even men. What are the causes? This condition is usually caused by a bacterial infection. Bacteria enter the breast tissue through cuts or openings in the skin. Typically, this occurs with breastfeeding because of cracked or irritated nipples. Sometimes, it can occur when there is no opening in the skin. This is usually caused by plugged milk ducts. Other causes include:  Nipple piercing.  Some forms of breast cancer. What are the signs or symptoms? Symptoms of this condition include:  Swelling, redness, tenderness, and pain in an area of the breast. The area may also feel warm to the touch. These symptoms usually affect the upper part of the breast, toward the armpit region.  Swelling of the glands under the arm on the same side.  Fever.  Rapid pulse.  Fatigue, headache, and flu-like muscle aches. If an infection is allowed to progress, a collection of pus (abscess) may develop. How is this diagnosed? This condition can usually be diagnosed based on a physical exam and your symptoms. You may also have other tests, such as:  Blood tests to determine if your body is fighting a bacterial infection.  Mammogram or ultrasound tests to rule out other problems or  diseases.  Testing of pus and other fluids. Pus from the breast may be collected and examined in the lab. If an abscess has developed, the fluid in the abscess can be removed with a needle. This test can be used to confirm the diagnosis and identify the bacteria present.  If you are breastfeeding, breast milk may be cultured and tested for bacteria. How is this treated? Treatment for this condition may include:  Applying heat or cold compresses to the affected area.  Medicine for pain.  Antibiotic medicine to treat a bacterial infection. This is usually taken by mouth.  Self-care such as rest and increased fluid intake.  If an abscess has developed, it may be treated by removing fluid with a needle. Mastitis that occurs with breastfeeding will sometimes go away on its own, so your health care provider may choose to wait 24 hours after first seeing you to decide whether a prescription medicine is needed. You may be told of different ways to help manage breastfeeding, such as continuing to breastfeed or pump in order to ensure adequate milk flow. Follow these instructions at home: Medicines  Take over-the-counter and prescription medicines only as told by your health care provider.  If you were prescribed an antibiotic medicine, take it as told by your health care provider. Do not stop taking the antibiotic even if you start to feel better. General instructions  Do not wear a tight or underwire bra. Wear a soft, supportive bra.  Increase your fluid intake, especially if you have a fever.  Get plenty of rest. If you are breastfeeding:  Continue to empty your breasts as often as possible either by breastfeeding or by using a breast pump. This will decrease the pressure and the pain that comes with it. Ask your health care provider if changes need to be made to your breastfeeding or pumping routine.  Keep your nipples clean and dry.  During breastfeeding, empty the first breast  completely before going to the other breast. If your baby is not emptying your breasts completely, use a breast pump to empty your breasts.  Use breast massage during feeding or pumping sessions.  If directed, apply moist heat to the affected area of your breast right before breastfeeding or pumping. Use the heat source that your health care  provider recommends.  If directed, put ice on the affected area of your breast right after breastfeeding or pumping: ? Put ice in a plastic bag. ? Place a towel between your skin and the bag. ? Leave the ice on for 20 minutes.  If you go back to work, pump your breasts while at work to stay within your nursing schedule.  Avoid allowing your breasts to become overly filled with milk (engorged). Contact a health care provider if:  You have pus-like discharge from the breast.  You have a fever.  Your symptoms do not improve within 2 days of starting treatment. Get help right away if:  Your pain and swelling are getting worse.  You have pain that is not controlled with medicine.  You have a red line extending from the breast toward your armpit. Summary  Mastitis is inflammation of the breast tissue. It occurs most often in women who are breastfeeding, but it can also affect non-breastfeeding women and some men.  This condition is usually caused by a bacterial infection.  This condition may be treated with hot and cold compresses, medicines, self-care, and certain breastfeeding strategies.  If you were prescribed an antibiotic medicine, take it as told by your health care provider. Do not stop taking the antibiotic even if you start to feel better. This information is not intended to replace advice given to you by your health care provider. Make sure you discuss any questions you have with your health care provider. Document Released: 02/10/2005 Document Revised: 01/23/2017 Document Reviewed: 03/04/2016 Elsevier Patient Education  2020 [employer]

## 2019-07-19 ENCOUNTER — Encounter: Payer: Self-pay | Admitting: General Practice

## 2019-08-18 ENCOUNTER — Other Ambulatory Visit: Payer: Self-pay

## 2019-08-18 ENCOUNTER — Other Ambulatory Visit (HOSPITAL_COMMUNITY)
Admission: RE | Admit: 2019-08-18 | Discharge: 2019-08-18 | Disposition: A | Discharge status: Home / Self Care | Payer: 59 | Source: Ambulatory Visit | Attending: Obstetrics and Gynecology | Admitting: Obstetrics and Gynecology

## 2019-08-18 ENCOUNTER — Ambulatory Visit (INDEPENDENT_AMBULATORY_CARE_PROVIDER_SITE_OTHER): Payer: 59 | Admitting: Obstetrics and Gynecology

## 2019-08-18 ENCOUNTER — Encounter: Payer: Self-pay | Admitting: Obstetrics and Gynecology

## 2019-08-18 VITALS — BP 126/77 | HR 87 | Ht 62.0 in | Wt 150.6 lb

## 2019-08-18 DIAGNOSIS — Z113 Encounter for screening for infections with a predominantly sexual mode of transmission: Secondary | ICD-10-CM

## 2019-08-18 DIAGNOSIS — Z01419 Encounter for gynecological examination (general) (routine) without abnormal findings: Secondary | ICD-10-CM | POA: Insufficient documentation

## 2019-08-18 NOTE — Progress Notes (Signed)
   WELL-WOMAN PHYSICAL & PAP Patient name: Jean Dickerson MRN [id number]  Date of birth: 1985/06/25 Chief Complaint:   Gynecologic Exam  History of Present Illness:   Shamela Haydon is a 34 y.o. G25P2002 African American/Caucasian female being seen today for a routine well-woman exam. 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 Current complaints: pelvic pain resolved since going to PT. Requests STI testing.  PCP: Dr. Evelena Peat      does desire STI labs Patient's last menstrual period was 07/25/2019. The current method of family planning is natural family planning.  Last pap 02/11/2018. Results were: normal Last mammogram: n/a. Results were: n/a. Family h/o breast cancer: unsure Last colonoscopy: n/a. Results were: n/a. Family h/o colorectal cancer: unsure Review of Systems:   Pertinent items are noted in HPI Denies any headaches, blurred vision, fatigue, shortness of breath, chest pain, abdominal pain, abnormal vaginal discharge/itching/odor/irritation, problems with periods, bowel movements, urination, or intercourse unless otherwise stated above. Pertinent History Reviewed:  Reviewed past medical,surgical, social and family history.  Reviewed problem list, medications and allergies. Physical Assessment:   Vitals:   08/18/19 1557  BP: 126/77  Pulse: 87  Weight: 150 lb 9.6 oz (68.3 kg)  Height: 5\' 2"  (1.575 m)  Body mass index is 27.55 kg/m.        Physical Examination:   General appearance - well appearing, and in no distress  Mental status - alert, oriented to person, place, and time  Psych:  She has a normal mood and affect  Skin - warm and dry, normal color, no suspicious lesions noted  Chest - effort normal, all lung fields clear to auscultation bilaterally  Heart - normal rate and regular rhythm  Neck:  midline trachea, no thyromegaly or nodules  Breasts - breasts appear normal, no suspicious masses, no skin or nipple changes or  axillary  nodes  Abdomen - soft, nontender, nondistended, no masses or organomegaly  Pelvic - VULVA: normal appearing vulva with no masses, tenderness or lesions  VAGINA: normal appearing vagina with normal color and discharge, no lesions  CERVIX: normal appearing cervix without discharge or lesions, no CMT  Thin prep pap is done with HR HPV cotesting  UTERUS: uterus is felt to be normal size, shape, consistency and nontender   ADNEXA: No adnexal masses or tenderness noted.  Rectal - deferred  Extremities:  No swelling or varicosities noted  No results found for this or any previous visit (from the past 24 hour(s)).  Assessment & Plan:  1) Well-Woman Exam with Pap - Cytology - PAP  2) Screen for STD (sexually transmitted disease) - Hepatitis B surface antigen - Hepatitis C antibody - HIV Antibody (routine testing w rflx) - RPR - Cervicovaginal ancillary only( [place])  Labs/procedures today: pap and STI testing  Mammogram @ age 64 or sooner if problems Colonoscopy @ age 12 or sooner if problems  Orders Placed This Encounter  Procedures  . Hepatitis B surface antigen  . Hepatitis C antibody  . HIV Antibody (routine testing w rflx)  . RPR    Meds: No orders of the defined types were placed in this encounter.   Follow-up: Return in about 1 year (around 08/17/2020) for Annual Exam.  08/19/2020 MSN, CNM 08/18/2019 5:23 PM

## 2019-08-19 LAB — HEPATITIS B SURFACE ANTIGEN: Hepatitis B Surface Ag: NEGATIVE

## 2019-08-19 LAB — HIV ANTIBODY (ROUTINE TESTING W REFLEX): HIV Screen 4th Generation wRfx: NONREACTIVE

## 2019-08-19 LAB — RPR: RPR Ser Ql: NONREACTIVE

## 2019-08-19 LAB — HEPATITIS C ANTIBODY: Hep C Virus Ab: <0.1 s/co ratio (ref 0.0–0.9)

## 2019-08-22 LAB — CERVICOVAGINAL ANCILLARY ONLY
Bacterial Vaginitis (gardnerella): NEGATIVE
Candida Glabrata: NEGATIVE
Candida Vaginitis: NEGATIVE
Chlamydia: NEGATIVE
Comment: NEGATIVE
Comment: NEGATIVE
Comment: NEGATIVE
Comment: NEGATIVE
Comment: NEGATIVE
Comment: NORMAL
Neisseria Gonorrhea: NEGATIVE
Trichomonas: NEGATIVE

## 2019-08-22 LAB — CYTOLOGY - PAP
Comment: NEGATIVE
Diagnosis: NEGATIVE
High risk HPV: NEGATIVE

## 2020-07-20 ENCOUNTER — Other Ambulatory Visit: Payer: Self-pay | Admitting: Obstetrics and Gynecology

## 2020-09-19 ENCOUNTER — Other Ambulatory Visit: Payer: Self-pay | Admitting: Family Medicine

## 2020-09-19 DIAGNOSIS — Z09 Encounter for follow-up examination after completed treatment for conditions other than malignant neoplasm: Secondary | ICD-10-CM

## 2020-09-19 DIAGNOSIS — N63 Unspecified lump in unspecified breast: Secondary | ICD-10-CM

## 2020-10-12 ENCOUNTER — Other Ambulatory Visit: Payer: Self-pay | Admitting: Obstetrics and Gynecology

## 2020-10-12 DIAGNOSIS — Z09 Encounter for follow-up examination after completed treatment for conditions other than malignant neoplasm: Secondary | ICD-10-CM

## 2020-10-12 DIAGNOSIS — N63 Unspecified lump in unspecified breast: Secondary | ICD-10-CM

## 2020-10-15 ENCOUNTER — Other Ambulatory Visit: Payer: Self-pay

## 2020-10-15 ENCOUNTER — Ambulatory Visit: Payer: Managed Care, Other (non HMO)

## 2020-10-15 ENCOUNTER — Other Ambulatory Visit: Payer: Self-pay | Admitting: Obstetrics and Gynecology

## 2020-10-15 ENCOUNTER — Ambulatory Visit
Admission: RE | Admit: 2020-10-15 | Discharge: 2020-10-15 | Disposition: A | Discharge status: Home / Self Care | Payer: Managed Care, Other (non HMO) | Source: Ambulatory Visit | Attending: Family Medicine | Admitting: Family Medicine

## 2020-10-15 DIAGNOSIS — Z09 Encounter for follow-up examination after completed treatment for conditions other than malignant neoplasm: Secondary | ICD-10-CM

## 2020-10-15 DIAGNOSIS — N63 Unspecified lump in unspecified breast: Secondary | ICD-10-CM

## 2020-12-25 ENCOUNTER — Ambulatory Visit: Payer: Managed Care, Other (non HMO)

## 2021-02-04 ENCOUNTER — Encounter: Payer: Self-pay | Admitting: *Deleted

## 2021-02-04 DIAGNOSIS — Z3480 Encounter for supervision of other normal pregnancy, unspecified trimester: Secondary | ICD-10-CM | POA: Insufficient documentation

## 2021-02-05 ENCOUNTER — Ambulatory Visit (INDEPENDENT_AMBULATORY_CARE_PROVIDER_SITE_OTHER): Payer: Managed Care, Other (non HMO) | Admitting: Advanced Practice Midwife

## 2021-02-05 ENCOUNTER — Other Ambulatory Visit: Payer: Self-pay

## 2021-02-05 ENCOUNTER — Other Ambulatory Visit (HOSPITAL_COMMUNITY)
Admission: RE | Admit: 2021-02-05 | Discharge: 2021-02-05 | Disposition: A | Discharge status: Home / Self Care | Payer: Managed Care, Other (non HMO) | Source: Ambulatory Visit | Attending: Advanced Practice Midwife | Admitting: Advanced Practice Midwife

## 2021-02-05 ENCOUNTER — Encounter: Payer: Self-pay | Admitting: Advanced Practice Midwife

## 2021-02-05 VITALS — BP 111/66 | HR 86 | Wt 142.0 lb

## 2021-02-05 DIAGNOSIS — N898 Other specified noninflammatory disorders of vagina: Secondary | ICD-10-CM

## 2021-02-05 DIAGNOSIS — Z3480 Encounter for supervision of other normal pregnancy, unspecified trimester: Secondary | ICD-10-CM | POA: Diagnosis present

## 2021-02-05 DIAGNOSIS — B3731 Acute candidiasis of vulva and vagina: Secondary | ICD-10-CM

## 2021-02-05 DIAGNOSIS — Z3A12 12 weeks gestation of pregnancy: Secondary | ICD-10-CM

## 2021-02-05 DIAGNOSIS — Z3481 Encounter for supervision of other normal pregnancy, first trimester: Secondary | ICD-10-CM

## 2021-02-05 LAB — HEPATITIS C ANTIBODY: HCV Ab: NEGATIVE

## 2021-02-05 NOTE — Progress Notes (Signed)
Last pap 6/21.  Pt declines Nipts.

## 2021-02-05 NOTE — Progress Notes (Signed)
Subjective:   Jean Dickerson is a 35 y.o. G3P2002 at [redacted]w[redacted]d by [place] today being seen today for her first obstetrical visit.  Her obstetrical history is significant for  SVD x 2  and has Acne; Migraine without aura; History of thrush; and Supervision of other normal pregnancy, antepartum on their problem list.. Patient does intend to breast feed. Pregnancy history fully reviewed.  Patient reports nausea.  HISTORY: OB History  Gravida Para Term Preterm AB Living  3 2 2  0 0 2  SAB IAB Ectopic Multiple Live Births  0 0 0 0 2    # Outcome Date GA Lbr Len/2nd Weight Sex Delivery Anes PTL Lv  3 Current           2 Term 07/26/18 [redacted]w[redacted]d 00:20 / 00:13 7 lb 0.7 oz (3.195 kg) M Vag-Spont EPI  LIV     Name: SHYVONNE, CHASTANG     Apgar1: 9  Apgar5: 9  1 Term 09/18/16 [redacted]w[redacted]d 01:48 / 00:58 6 lb 7.5 oz (2.934 kg) F Vag-Spont EPI  LIV     Name: Batterson,GIRL Malvika     Apgar1: 9  Apgar5: 9   Past Medical History:  Diagnosis Date   Allergy    Anxiety    Depression    Vaginal Pap smear, abnormal    Past Surgical History:  Procedure Laterality Date   WISDOM TOOTH EXTRACTION     Family History  Problem Relation Age of Onset   Hypertension Father    Mental illness Father    Cancer Maternal Grandmother    Liver disease Maternal Grandfather    Emphysema Paternal Grandmother    Lung cancer Paternal Grandfather    Hypertension Mother    Hyperlipidemia Mother    Breast cancer Paternal Aunt    Social History   Tobacco Use   Smoking status: Never   Smokeless tobacco: Never  Vaping Use   Vaping Use: Never used  Substance Use Topics   Alcohol use: Yes    Alcohol/week: 4.0 standard drinks    Types: 2 Glasses of wine, 2 Cans of beer per week    Comment: occ   Drug use: Never   No Known Allergies Current Outpatient Medications on File Prior to Visit  Medication Sig Dispense Refill   doxylamine, Sleep, (UNISOM) 25 MG tablet Take 25 mg by mouth at bedtime as needed.     Elderberry  575 MG/5ML SYRP Take by mouth.     Prenatal Vit-Fe Fumarate-FA (PRENATAL MULTIVITAMIN) TABS tablet Take 1 tablet by mouth daily at 12 noon.     Probiotic Product (PROBIOTIC-10 PO) Take by mouth.     pyridOXINE (B-6) 50 MG tablet Take 50 mg by mouth daily.     acetaminophen (TYLENOL) 325 MG tablet Take 2 tablets (650 mg total) by mouth every 4 (four) hours as needed (for pain scale < 4). (Patient not taking: Reported on 08/18/2019)     ibuprofen (ADVIL) 600 MG tablet Take 1 tablet (600 mg total) by mouth every 6 (six) hours. (Patient not taking: Reported on 08/18/2019) 30 tablet 0   MAGNESIUM PO Take by mouth. (Patient not taking: Reported on 02/05/2021)     No current facility-administered medications on file prior to visit.     Indications for ASA therapy (per uptodate) One of the following: Previous pregnancy with preeclampsia, especially early onset and with an adverse outcome No Multifetal gestation No Chronic hypertension No Type 1 or 2 diabetes mellitus No Chronic kidney  disease No Autoimmune disease (antiphospholipid syndrome, systemic lupus erythematosus) No   Two or more of the following: Nulliparity No Obesity (body mass index >30 kg/m2) No Family history of preeclampsia in mother or sister No Age ?35 years No Sociodemographic characteristics (African American race, low socioeconomic level) No Personal risk factors (eg, previous pregnancy with low birth weight or small for gestational age infant, previous adverse pregnancy outcome [eg, stillbirth], interval >10 years between pregnancies) No   Indications for early 1 hour GTT (per uptodate)  BMI >25 (>23 in Asian women) AND one of the following  Gestational diabetes mellitus in a previous pregnancy No Glycated hemoglobin ?5.7 percent (39 mmol/mol), impaired glucose tolerance, or impaired fasting glucose on previous testing No First-degree relative with diabetes No High-risk race/ethnicity (eg, African American, Latino,  Native American, [place] American, Pacific Islander) No History of cardiovascular disease No Hypertension or on therapy for hypertension No High-density lipoprotein cholesterol level <35 mg/dL (7.67 mmol/L) and/or a triglyceride level >250 mg/dL (3.41 mmol/L) No Polycystic ovary syndrome No Physical inactivity No Other clinical condition associated with insulin resistance (eg, severe obesity, acanthosis nigricans) No Previous birth of an infant weighing ?4000 g No Previous stillbirth of unknown cause No Exam   Vitals:   02/05/21 1327  BP: 111/66  Pulse: 86  Weight: 142 lb (64.4 kg)   Fetal Heart Rate (bpm): 158  VS reviewed, nursing note reviewed,  Constitutional: well developed, well nourished, no distress HEENT: normocephalic CV: normal rate Pulm/chest wall: normal effort Abdomen: soft Neuro: alert and oriented x 3 Skin: warm, dry Psych: affect normal    Assessment:   Pregnancy: [id number] Patient Active Problem List   Diagnosis Date Noted   Supervision of other normal pregnancy, antepartum 02/04/2021   History of thrush 07/28/2018   Migraine without aura 11/04/2015   Acne 02/15/2015     Plan:  1. Supervision of other normal pregnancy, antepartum --Anticipatory guidance about next visits/weeks of pregnancy given. --Interested in waterbirth, questions answered. Pt took class with previous pregnancy, will see midwives for most visits. Discussed waterbirth as option for low risk pregnancy. Pregnancy is hard to predict and things may arise that prevent immersion in water but labor can be supported with freedom of movement, birthing ball, shower and birth can be supported in position of pt choosing, etc, even if waterbirth becomes unavailable. Pt states understanding.   --Pap wnl in 2021  - Culture, OB Urine - GC/Chlamydia probe amp ([place])not at [facility] - [place] bedside; Future - Obstetric panel - HIV antibody (with reflex) - Hepatitis C Antibody - Babyscripts Schedule  Optimization - Cervicovaginal ancillary only( [place]) - [place] MFM OB COMP + 14 WK; Future  2. Vaginal itching  - Cervicovaginal ancillary only( [place])    Initial labs drawn. Continue prenatal vitamins. Discussed and offered genetic screening options, including Quad screen/AFP, NIPS testing, and option to decline testing. Benefits/risks/alternatives reviewed. Pt aware that anatomy US is form of genetic screening with lower accuracy in detecting trisomies than blood work.  Pt declines genetic screening today.  Ultrasound discussed; fetal anatomic survey: requested. Problem list reviewed and updated. The nature of [place] [facility] with multiple MDs and other Advanced Practice Providers was explained to patient; also emphasized that residents, students are part of our team. Routine obstetric precautions reviewed. Return in about 8 weeks (around 04/02/2021).   Sharen Counter, CNM 02/05/21 5:52 PM

## 2021-02-05 NOTE — Progress Notes (Signed)
Bedside U/S shows single IUP with FHT of 158 BPM and CRL is 57.63mm  GA and HC measures [redacted]w[redacted]d

## 2021-02-06 LAB — HIV ANTIBODY (ROUTINE TESTING W REFLEX): HIV 1&2 Ab, 4th Generation: NONREACTIVE

## 2021-02-06 LAB — OBSTETRIC PANEL
Absolute Monocytes: 510 cells/uL (ref 200–950)
Antibody Screen: NOT DETECTED
Basophils Absolute: 27 cells/uL (ref 0–200)
Basophils Relative: 0.3 %
Eosinophils Absolute: 127 cells/uL (ref 15–500)
Eosinophils Relative: 1.4 %
HCT: 34.8 % — ABNORMAL LOW (ref 35.0–45.0)
Hemoglobin: 11.7 g/dL (ref 11.7–15.5)
Hepatitis B Surface Ag: NONREACTIVE
Lymphs Abs: 1866 cells/uL (ref 850–3900)
MCH: 28.5 pg (ref 27.0–33.0)
MCHC: 33.6 g/dL (ref 32.0–36.0)
MCV: 84.9 fL (ref 80.0–100.0)
MPV: 10.3 fL (ref 7.5–12.5)
Monocytes Relative: 5.6 %
Neutro Abs: 6570 cells/uL (ref 1500–7800)
Neutrophils Relative %: 72.2 %
Platelets: 291 10*3/uL (ref 140–400)
RBC: 4.1 10*6/uL (ref 3.80–5.10)
RDW: 13.8 % (ref 11.0–15.0)
RPR Ser Ql: NONREACTIVE
Rubella: 1.57 Index
Total Lymphocyte: 20.5 %
WBC: 9.1 10*3/uL (ref 3.8–10.8)

## 2021-02-06 LAB — HEPATITIS C ANTIBODY
Hepatitis C Ab: NONREACTIVE
SIGNAL TO CUT-OFF: 0.05 (ref <1.00)

## 2021-02-07 LAB — CERVICOVAGINAL ANCILLARY ONLY
Bacterial Vaginitis (gardnerella): NEGATIVE
Candida Glabrata: NEGATIVE
Candida Vaginitis: POSITIVE — AB
Comment: NEGATIVE
Comment: NEGATIVE
Comment: NEGATIVE

## 2021-02-07 LAB — GC/CHLAMYDIA PROBE AMP (~~LOC~~) NOT AT ARMC
Chlamydia: NEGATIVE
Comment: NEGATIVE
Comment: NORMAL
Neisseria Gonorrhea: NEGATIVE

## 2021-02-08 LAB — URINE CULTURE, OB REFLEX

## 2021-02-08 LAB — CULTURE, OB URINE

## 2021-02-08 MED ORDER — TERCONAZOLE 0.4 % VA CREA: 1.0000 | TOPICAL_CREAM | Freq: Every day | VAGINAL | 0 refills | Status: DC

## 2021-02-08 NOTE — Addendum Note (Signed)
Addended by: Sharen Counter A on: 02/08/2021 12:34 AM   Modules accepted: Orders

## 2021-03-26 ENCOUNTER — Other Ambulatory Visit: Payer: Self-pay

## 2021-03-26 ENCOUNTER — Ambulatory Visit: Payer: Managed Care, Other (non HMO) | Attending: Advanced Practice Midwife

## 2021-03-26 DIAGNOSIS — Z3A19 19 weeks gestation of pregnancy: Secondary | ICD-10-CM | POA: Insufficient documentation

## 2021-03-26 DIAGNOSIS — Z3480 Encounter for supervision of other normal pregnancy, unspecified trimester: Secondary | ICD-10-CM | POA: Diagnosis not present

## 2021-03-26 DIAGNOSIS — O358XX0 Maternal care for other (suspected) fetal abnormality and damage, not applicable or unspecified: Secondary | ICD-10-CM | POA: Insufficient documentation

## 2021-03-26 DIAGNOSIS — Z363 Encounter for antenatal screening for malformations: Secondary | ICD-10-CM | POA: Diagnosis not present

## 2021-03-27 ENCOUNTER — Other Ambulatory Visit: Payer: Self-pay | Admitting: *Deleted

## 2021-03-27 DIAGNOSIS — Z362 Encounter for other antenatal screening follow-up: Secondary | ICD-10-CM

## 2021-04-02 ENCOUNTER — Other Ambulatory Visit: Payer: Self-pay

## 2021-04-02 ENCOUNTER — Ambulatory Visit (INDEPENDENT_AMBULATORY_CARE_PROVIDER_SITE_OTHER): Payer: Managed Care, Other (non HMO) | Admitting: Advanced Practice Midwife

## 2021-04-02 VITALS — BP 104/59 | HR 89 | Wt 153.0 lb

## 2021-04-02 DIAGNOSIS — Z3A20 20 weeks gestation of pregnancy: Secondary | ICD-10-CM

## 2021-04-02 DIAGNOSIS — Z3480 Encounter for supervision of other normal pregnancy, unspecified trimester: Secondary | ICD-10-CM

## 2021-04-02 NOTE — Progress Notes (Signed)
° ° °  PRENATAL VISIT NOTE  Subjective:  Jean Dickerson is a 36 y.o. G3P2002 at [redacted]w[redacted]d being seen today for ongoing prenatal care.  She is currently monitored for the following issues for this low-risk pregnancy and has Acne; Migraine without aura; History of thrush; and Supervision of other normal pregnancy, antepartum on their problem list.  Patient reports no complaints.  Contractions: Not present. Vag. Bleeding: None.  Movement: Present. Denies leaking of fluid.   The following portions of the patient's history were reviewed and updated as appropriate: allergies, current medications, past family history, past medical history, past social history, past surgical history and problem list.   Objective:   Vitals:   04/02/21 1304  BP: (!) 104/59  Pulse: 89  Weight: 153 lb (69.4 kg)    Fetal Status: Fetal Heart Rate (bpm): 145   Movement: Present     General:  Alert, oriented and cooperative. Patient is in no acute distress.  Skin: Skin is warm and dry. No rash noted.   Cardiovascular: Normal heart rate noted  Respiratory: Normal respiratory effort, no problems with respiration noted  Abdomen: Soft, gravid, appropriate for gestational age.  Pain/Pressure: Absent     Pelvic: Cervical exam deferred        Extremities: Normal range of motion.  Edema: None  Mental Status: Normal mood and affect. Normal behavior. Normal judgment and thought content.   Assessment and Plan:  Pregnancy: G3P2002 at [redacted]w[redacted]d 1. Supervision of other normal pregnancy, antepartum --Anticipatory guidance about next visits/weeks of pregnancy given.  --Discussed birth plan, pt plans waterbirth, no barriers at this time --Next appt in 4 weeks  2. [redacted] weeks gestation of pregnancy   Preterm labor symptoms and general obstetric precautions including but not limited to vaginal bleeding, contractions, leaking of fluid and fetal movement were reviewed in detail with the patient. Please refer to After Visit Summary for  other counseling recommendations.   Return in about 4 weeks (around 04/30/2021).  Future Appointments  Date Time Provider [place]  04/23/2021 12:30 PM [facility] NURSE [facility]  04/23/2021 12:45 PM [facility]  04/30/2021  1:50 PM Leftwich-Kirby, Kathie Dike, CNM [facility]    Fatima Blank, CNM

## 2021-04-02 NOTE — Patient Instructions (Signed)
?  Considering Waterbirth? ?Guide for patients at [facility] ([facility] ?Why consider waterbirth? ?Gentle birth for babies  ?Less pain medicine used in labor  ?May allow for passive descent/less pushing  ?May reduce perineal tears  ?More mobility and instinctive maternal position changes  ?Increased maternal relaxation  ? ?Is waterbirth safe? What are the risks of infection, drowning or other complications? ?Infection:  ?Very low risk (3.7 % for tub vs 4.8% for bed)  ?7 in 8000 waterbirths with documented infection  ?Poorly cleaned equipment most common cause  ?Slightly lower group B strep transmission rate  ?Drowning  ?Maternal:  ?Very low risk  ?Related to seizures or fainting  ?Newborn:  ?Very low risk. No evidence of increased risk of respiratory problems in multiple large studies  ?Physiological protection from breathing under water  ?Avoid underwater birth if there are any fetal complications  ?Once baby's head is out of the water, keep it out.  ?Birth complication  ?Some reports of cord trauma, but risk decreased by bringing baby to surface gradually  ?No evidence of increased risk of shoulder dystocia. Mothers can usually change positions faster in water than in a bed, possibly aiding the maneuvers to free the shoulder.  ? ?There are 2 things you MUST do to have a waterbirth with [facility]: ?Attend a waterbirth class at [facility] [place]   ?3rd Wednesday of every month from 7-9 pm (virtual during COVID) ?Free ?Register online at [email or web address] or [email or web address] or by calling [phone] ?Bring us the certificate from the class to your prenatal appointment or send via MyChart ?Meet with a midwife at 36 weeks* to see if you can still plan a waterbirth and to sign the consent.  ? ?*We also recommend that you schedule as many of your prenatal visits with a midwife as possible.   ? ?Helpful information: ?You may want to bring a bathing suit top to the hospital  to wear during labor but this is optional.  All other supplies are provided by the hospital. ?Please arrive at the hospital with signs of active labor, and do not wait at home until late in labor. It takes 45 min- 2 hours for COVID testing, fetal monitoring, and check in to your room to take place, plus transport and filling of the waterbirth tub.   ? ?Things that would prevent you from having a waterbirth: ?Unknown or Positive COVID-19 diagnosis upon admission to hospital* ?Premature, <37wks  ?Previous cesarean birth  ?Presence of thick meconium-stained fluid  ?Multiple gestation (Twins, triplets, etc.)  ?Uncontrolled diabetes or gestational diabetes requiring medication  ?Hypertension diagnosed in pregnancy or preexisting hypertension (gestational hypertension, preeclampsia, or chronic hypertension) ?Fetal growth restriction (your baby measures less than 10th percentile on ultrasound) ?Heavy vaginal bleeding  ?Non-reassuring fetal heart rate  ?Active infection (MRSA, etc.). Group B Strep is NOT a contraindication for waterbirth.  ?If your labor has to be induced and induction method requires continuous monitoring of the baby's heart rate  ?Other risks/issues identified by your obstetrical provider  ? ?Please remember that birth is unpredictable. Under certain unforeseeable circumstances your provider may advise against giving birth in the tub. These decisions will be made on a case-by-case basis and with the safety of you and your baby as our highest priority. ? ? ?*Please remember that in order to have a waterbirth, you must test Negative to COVID-19 upon admission to the hospital. ? ?Updated 06/04/20 ? ?

## 2021-04-23 ENCOUNTER — Ambulatory Visit: Payer: Managed Care, Other (non HMO)

## 2021-04-30 ENCOUNTER — Ambulatory Visit (INDEPENDENT_AMBULATORY_CARE_PROVIDER_SITE_OTHER): Payer: Managed Care, Other (non HMO) | Admitting: Advanced Practice Midwife

## 2021-04-30 ENCOUNTER — Other Ambulatory Visit: Payer: Self-pay

## 2021-04-30 VITALS — BP 110/64 | HR 77 | Wt 160.0 lb

## 2021-04-30 DIAGNOSIS — Z202 Contact with and (suspected) exposure to infections with a predominantly sexual mode of transmission: Secondary | ICD-10-CM | POA: Diagnosis not present

## 2021-04-30 DIAGNOSIS — Z3A24 24 weeks gestation of pregnancy: Secondary | ICD-10-CM

## 2021-04-30 DIAGNOSIS — Z3480 Encounter for supervision of other normal pregnancy, unspecified trimester: Secondary | ICD-10-CM | POA: Diagnosis not present

## 2021-04-30 MED ORDER — PENICILLIN G BENZATHINE 1200000 UNIT/2ML IM SUSY
2.4000 10*6.[IU] | PREFILLED_SYRINGE | Freq: Once | INTRAMUSCULAR | Status: AC
  Administered 2021-04-30: 2.4 10*6.[IU] via INTRAMUSCULAR

## 2021-04-30 NOTE — Progress Notes (Signed)
? ?  PRENATAL VISIT NOTE ? ?Subjective:  ?Jean Dickerson is a 36 y.o. G3P2002 at [redacted]w[redacted]d being seen today for ongoing prenatal care.  She is currently monitored for the following issues for this low-risk pregnancy and has Acne; Migraine without aura; History of thrush; and Supervision of other normal pregnancy, antepartum on their problem list. ? ?Patient reports no complaints.  Contractions: Not present. Vag. Bleeding: None.  Movement: Present. Denies leaking of fluid.  ? ?The following portions of the patient's history were reviewed and updated as appropriate: allergies, current medications, past family history, past medical history, past social history, past surgical history and problem list.  ? ?Objective:  ? ?Vitals:  ? 04/30/21 1352  ?BP: 110/64  ?Pulse: 77  ?Weight: 160 lb (72.6 kg)  ? ? ?Fetal Status: Fetal Heart Rate (bpm): 146 Fundal Height: 24 cm Movement: Present    ? ?General:  Alert, oriented and cooperative. Patient is in no acute distress.  ?Skin: Skin is warm and dry. No rash noted.   ?Cardiovascular: Normal heart rate noted  ?Respiratory: Normal respiratory effort, no problems with respiration noted  ?Abdomen: Soft, gravid, appropriate for gestational age.  Pain/Pressure: Absent     ?Pelvic: Cervical exam deferred        ?Extremities: Normal range of motion.  Edema: None  ?Mental Status: Normal mood and affect. Normal behavior. Normal judgment and thought content.  ? ?Assessment and Plan:  ?Pregnancy: G3P2002 at [redacted]w[redacted]d ? ?1. Supervision of other normal pregnancy, antepartum ?--Anticipatory guidance about next visits/weeks of pregnancy given.  ?--next appt in 4 weeks for GTT ? ? ?2. [redacted] weeks gestation of pregnancy ? ? ?3. Exposure to syphilis ?--Pt partner dx with syphilis, possibly latent syphilis, recently.  Pt RPR nonreactive at [facility] on 02/05/21 and no prior positive testing. ?--Per health department, recommend one dose of PCN G, retest RPR today, if remains negative, only one dose of medicine  for prophylaxis.   ?--Reviewed safety of PCN in pregnancy, questions answered ? ?- HIV antibody (with reflex) ?- RPR ?- penicillin g benzathine (BICILLIN LA) 1200000 UNIT/2ML injection 2.4 Million Units  ? ? ?Preterm labor symptoms and general obstetric precautions including but not limited to vaginal bleeding, contractions, leaking of fluid and fetal movement were reviewed in detail with the patient. ?Please refer to After Visit Summary for other counseling recommendations.  ? ?Return in about 4 weeks (around 05/28/2021). ? ?Future Appointments  ?Date Time Provider Department Center  ?05/28/2021  8:30 AM Leftwich-Kirby, Wilmer Floor, CNM [facility]  ? ? ?Sharen Counter, CNM  ?

## 2021-05-01 LAB — HIV ANTIBODY (ROUTINE TESTING W REFLEX): HIV 1&2 Ab, 4th Generation: NONREACTIVE

## 2021-05-01 LAB — RPR: RPR Ser Ql: NONREACTIVE

## 2021-05-28 ENCOUNTER — Ambulatory Visit (INDEPENDENT_AMBULATORY_CARE_PROVIDER_SITE_OTHER): Payer: Managed Care, Other (non HMO) | Admitting: Advanced Practice Midwife

## 2021-05-28 VITALS — BP 113/74 | HR 90 | Wt 169.0 lb

## 2021-05-28 DIAGNOSIS — Z3A28 28 weeks gestation of pregnancy: Secondary | ICD-10-CM

## 2021-05-28 DIAGNOSIS — Z3480 Encounter for supervision of other normal pregnancy, unspecified trimester: Secondary | ICD-10-CM

## 2021-05-28 NOTE — Progress Notes (Signed)
Pt declined tdap

## 2021-05-28 NOTE — Progress Notes (Signed)
? ?  PRENATAL VISIT NOTE ? ?Subjective:  ?Jean Dickerson is a 36 y.o. G3P2002 at [redacted]w[redacted]d being seen today for ongoing prenatal care.  She is currently monitored for the following issues for this low-risk pregnancy and has Acne; Migraine without aura; History of thrush; and Supervision of other normal pregnancy, antepartum on their problem list. ? ?Patient reports occasional contractions.  Contractions: Not present. Vag. Bleeding: None.  Movement: Present. Denies leaking of fluid.  ? ?The following portions of the patient's history were reviewed and updated as appropriate: allergies, current medications, past family history, past medical history, past social history, past surgical history and problem list.  ? ?Objective:  ? ?Vitals:  ? 05/28/21 4034  ?BP: 113/74  ?Pulse: 90  ?Weight: 169 lb (76.7 kg)  ? ? ?Fetal Status: Fetal Heart Rate (bpm): 145   Movement: Present    ? ?General:  Alert, oriented and cooperative. Patient is in no acute distress.  ?Skin: Skin is warm and dry. No rash noted.   ?Cardiovascular: Normal heart rate noted  ?Respiratory: Normal respiratory effort, no problems with respiration noted  ?Abdomen: Soft, gravid, appropriate for gestational age.  Pain/Pressure: Absent     ?Pelvic: Cervical exam deferred        ?Extremities: Normal range of motion.  Edema: None  ?Mental Status: Normal mood and affect. Normal behavior. Normal judgment and thought content.  ? ?Assessment and Plan:  ?Pregnancy: G3P2002 at [redacted]w[redacted]d ?1. Supervision of other normal pregnancy, antepartum ?--Anticipatory guidance about next visits/weeks of pregnancy given.  ? ?- 2Hr GTT w/ 1 Hr Carpenter 75 g ?- HIV antibody (with reflex) ?- CBC ?- RPR ? ?2. [redacted] weeks gestation of pregnancy ? ? ?Preterm labor symptoms and general obstetric precautions including but not limited to vaginal bleeding, contractions, leaking of fluid and fetal movement were reviewed in detail with the patient. ?Please refer to After Visit Summary for other  counseling recommendations.  ? ?Return in about 2 weeks (around 06/11/2021) for LOB, Midwife preferred. ? ?Future Appointments  ?Date Time Provider Department Center  ?06/11/2021  2:50 PM Leftwich-Kirby, Wilmer Floor, CNM [facility]  ? ? ?Sharen Counter, CNM  ?

## 2021-05-29 LAB — CBC
HCT: 34.4 % — ABNORMAL LOW (ref 35.0–45.0)
Hemoglobin: 11.5 g/dL — ABNORMAL LOW (ref 11.7–15.5)
MCH: 30.4 pg (ref 27.0–33.0)
MCHC: 33.4 g/dL (ref 32.0–36.0)
MCV: 91 fL (ref 80.0–100.0)
MPV: 9.3 fL (ref 7.5–12.5)
Platelets: 255 10*3/uL (ref 140–400)
RBC: 3.78 10*6/uL — ABNORMAL LOW (ref 3.80–5.10)
RDW: 13.1 % (ref 11.0–15.0)
WBC: 9 10*3/uL (ref 3.8–10.8)

## 2021-05-29 LAB — 2HR GTT W 1 HR, CARPENTER, 75 G
Glucose, 1 Hr, Gest: 95 mg/dL (ref 65–179)
Glucose, 2 Hr, Gest: 75 mg/dL (ref 65–152)
Glucose, Fasting, Gest: 68 mg/dL (ref 65–91)

## 2021-05-31 ENCOUNTER — Encounter: Payer: Self-pay | Admitting: Advanced Practice Midwife

## 2021-06-03 ENCOUNTER — Other Ambulatory Visit: Payer: Self-pay

## 2021-06-03 DIAGNOSIS — Z3480 Encounter for supervision of other normal pregnancy, unspecified trimester: Secondary | ICD-10-CM

## 2021-06-03 MED ORDER — BREAST PUMP MISC: 1.0000 | 0 refills | Status: DC | PRN

## 2021-06-03 NOTE — Progress Notes (Signed)
Rx for breast pump sent ?

## 2021-06-11 ENCOUNTER — Ambulatory Visit (INDEPENDENT_AMBULATORY_CARE_PROVIDER_SITE_OTHER): Payer: Managed Care, Other (non HMO) | Admitting: Advanced Practice Midwife

## 2021-06-11 VITALS — BP 119/73 | HR 96 | Wt 173.0 lb

## 2021-06-11 DIAGNOSIS — Z3480 Encounter for supervision of other normal pregnancy, unspecified trimester: Secondary | ICD-10-CM

## 2021-06-11 DIAGNOSIS — Z3A30 30 weeks gestation of pregnancy: Secondary | ICD-10-CM

## 2021-06-11 NOTE — Progress Notes (Addendum)
? ?  PRENATAL VISIT NOTE ? ?Subjective:  ?Jean Dickerson is a 36 y.o. G3P2002 at [redacted]w[redacted]d being seen today for ongoing prenatal care.  She is currently monitored for the following issues for this low-risk pregnancy and has Acne; Migraine without aura; History of thrush; and Supervision of other normal pregnancy, antepartum on their problem list. ? ?Patient reports occasional contractions.  Contractions: Not present. Vag. Bleeding: None.  Movement: Present. Denies leaking of fluid.  ? ?The following portions of the patient's history were reviewed and updated as appropriate: allergies, current medications, past family history, past medical history, past social history, past surgical history and problem list.  ? ?Objective:  ? ?Vitals:  ? 06/11/21 1451  ?BP: 119/73  ?Pulse: 96  ?Weight: 173 lb (78.5 kg)  ? ? ?Fetal Status: Fetal Heart Rate (bpm): 147 Fundal Height: 30 cm Movement: Present    ? ?General:  Alert, oriented and cooperative. Patient is in no acute distress.  ?Skin: Skin is warm and dry. No rash noted.   ?Cardiovascular: Normal heart rate noted  ?Respiratory: Normal respiratory effort, no problems with respiration noted  ?Abdomen: Soft, gravid, appropriate for gestational age.  Pain/Pressure: Absent     ?Pelvic: Cervical exam deferred        ?Extremities: Normal range of motion.  Edema: None  ?Mental Status: Normal mood and affect. Normal behavior. Normal judgment and thought content.  ? ?Assessment and Plan:  ?Pregnancy: [id number] at [redacted]w[redacted]d ?1. Supervision of other normal pregnancy, antepartum ?--Anticipatory guidance about next visits/weeks of pregnancy given.  ?--Interested in waterbirth, class completed, consent signed today. ?-- Discussed waterbirth as option for low risk pregnancy. Pregnancy is hard to predict and things may arise that prevent immersion in water but labor can be supported with freedom of movement, birthing ball, shower and birth can be supported in position of pt choosing, etc, even if  waterbirth becomes unavailable. Pt states understanding.  ? ?2. [redacted] weeks gestation of pregnancy ? ? ?Preterm labor symptoms and general obstetric precautions including but not limited to vaginal bleeding, contractions, leaking of fluid and fetal movement were reviewed in detail with the patient. ?Please refer to After Visit Summary for other counseling recommendations.  ? ?Return in about 2 weeks (around 06/25/2021) for Midwife preferred, LOB. ? ?Future Appointments  ?Date Time Provider Department Center  ?06/25/2021  9:50 AM Donette Larry, CNM [facility]  ?07/09/2021  9:50 AM Brand Males, CNM [facility]  ?07/23/2021  2:50 PM Brand Males, CNM [facility]  ? ? ? ?Sharen Counter, CNM  ?

## 2021-06-25 ENCOUNTER — Ambulatory Visit (INDEPENDENT_AMBULATORY_CARE_PROVIDER_SITE_OTHER): Payer: Managed Care, Other (non HMO) | Admitting: Certified Nurse Midwife

## 2021-06-25 VITALS — BP 123/72 | HR 97 | Wt 178.0 lb

## 2021-06-25 DIAGNOSIS — Z3A32 32 weeks gestation of pregnancy: Secondary | ICD-10-CM

## 2021-06-25 DIAGNOSIS — Z3480 Encounter for supervision of other normal pregnancy, unspecified trimester: Secondary | ICD-10-CM

## 2021-06-25 NOTE — Progress Notes (Signed)
Subjective:  ?Jean Dickerson is a 36 y.o. G3P2002 at [redacted]w[redacted]d being seen today for ongoing prenatal care.  She is currently monitored for the following issues for this low-risk pregnancy and has Acne; Migraine without aura; History of thrush; and Supervision of other normal pregnancy, antepartum on their problem list. ? ?Patient reports no complaints.  Contractions: Not present. Vag. Bleeding: None.  Movement: Present. Denies leaking of fluid.  ? ?The following portions of the patient's history were reviewed and updated as appropriate: allergies, current medications, past family history, past medical history, past social history, past surgical history and problem list. Problem list updated. ? ?Objective:  ? ?Vitals:  ? 06/25/21 0956  ?BP: 123/72  ?Pulse: 97  ?Weight: 178 lb (80.7 kg)  ? ? ?Fetal Status: Fetal Heart Rate (bpm): 141 Fundal Height: 32 cm Movement: Present  Presentation: Vertex ? ?General:  Alert, oriented and cooperative. Patient is in no acute distress.  ?Skin: Skin is warm and dry. No rash noted.   ?Cardiovascular: Normal heart rate noted  ?Respiratory: Normal respiratory effort, no problems with respiration noted  ?Abdomen: Soft, gravid, appropriate for gestational age. Pain/Pressure: Absent     ?Pelvic: Vag. Bleeding: None Vag D/C Character: Thin   ?Cervical exam deferred        ?Extremities: Normal range of motion.  Edema: None  ?Mental Status: Normal mood and affect. Normal behavior. Normal judgment and thought content.  ? ?Urinalysis:     ? ?Assessment and Plan:  ?Pregnancy: G3P2002 at [redacted]w[redacted]d ? ?1. [redacted] weeks gestation of pregnancy ? ?2. Supervision of other normal pregnancy, antepartum ? ? ?Preterm labor symptoms and general obstetric precautions including but not limited to vaginal bleeding, contractions, leaking of fluid and fetal movement were reviewed in detail with the patient. ?Please refer to After Visit Summary for other counseling recommendations.  ?Return in about 2 weeks (around  07/09/2021). ? ? ?Julianne Handler, CNM ? ?

## 2021-07-09 ENCOUNTER — Ambulatory Visit (INDEPENDENT_AMBULATORY_CARE_PROVIDER_SITE_OTHER): Payer: Managed Care, Other (non HMO)

## 2021-07-09 VITALS — BP 115/70 | HR 99 | Wt 179.0 lb

## 2021-07-09 DIAGNOSIS — Z3480 Encounter for supervision of other normal pregnancy, unspecified trimester: Secondary | ICD-10-CM

## 2021-07-09 DIAGNOSIS — Z3A34 34 weeks gestation of pregnancy: Secondary | ICD-10-CM

## 2021-07-09 DIAGNOSIS — Z3483 Encounter for supervision of other normal pregnancy, third trimester: Secondary | ICD-10-CM

## 2021-07-09 NOTE — Progress Notes (Signed)
? ?  PRENATAL VISIT NOTE ? ?Subjective:  ?Jean Dickerson is a 36 y.o. G3P2002 at [redacted]w[redacted]d being seen today for ongoing prenatal care.  She is currently monitored for the following issues for this low-risk pregnancy and has Acne; Migraine without aura; History of thrush; and Supervision of other normal pregnancy, antepartum on their problem list. ? ?Patient reports no complaints.  Contractions: Irritability. Vag. Bleeding: None.  Movement: Present. Denies leaking of fluid.  ? ?The following portions of the patient's history were reviewed and updated as appropriate: allergies, current medications, past family history, past medical history, past social history, past surgical history and problem list.  ? ?Objective:  ? ?Vitals:  ? 07/09/21 0951  ?BP: 115/70  ?Pulse: 99  ?Weight: 179 lb (81.2 kg)  ? ? ?Fetal Status: Fetal Heart Rate (bpm): 154 Fundal Height: 34 cm Movement: Present    ? ?General:  Alert, oriented and cooperative. Patient is in no acute distress.  ?Skin: Skin is warm and dry. No rash noted.   ?Cardiovascular: Normal heart rate noted  ?Respiratory: Normal respiratory effort, no problems with respiration noted  ?Abdomen: Soft, gravid, appropriate for gestational age.  Pain/Pressure: Present     ?Pelvic: Cervical exam deferred        ?Extremities: Normal range of motion.  Edema: None  ?Mental Status: Normal mood and affect. Normal behavior. Normal judgment and thought content.  ? ?Assessment and Plan:  ?Pregnancy: [id number] at [redacted]w[redacted]d ?1. Supervision of other normal pregnancy, antepartum ?- Routine OB. Doing well, no concerns. Occasional BH contractions ?- Interested in waterbirth, questions answered. Discussed waterbirth as option for low risk pregnancy. Pregnancy is hard to predict and things may arise that prevent immersion in water but labor can be supported with freedom of movement, birthing ball, shower and birth can be supported in position of pt choosing, etc, even if waterbirth becomes unavailable. Pt  states understanding. ?- GBS and GC/CT next visit ? ?2. [redacted] weeks gestation of pregnancy ?- Endorses active fetal movement ?- FH appropriate ? ? ?Preterm labor symptoms and general obstetric precautions including but not limited to vaginal bleeding, contractions, leaking of fluid and fetal movement were reviewed in detail with the patient. ?Please refer to After Visit Summary for other counseling recommendations.  ? ?Return in about 2 weeks (around 07/23/2021). ? ?Future Appointments  ?Date Time Provider Department Center  ?07/23/2021  2:50 PM Brand Males, CNM [facility]  ? ? ?Brand Males, CNM ? ?

## 2021-07-23 ENCOUNTER — Ambulatory Visit (INDEPENDENT_AMBULATORY_CARE_PROVIDER_SITE_OTHER): Payer: Managed Care, Other (non HMO)

## 2021-07-23 ENCOUNTER — Other Ambulatory Visit (HOSPITAL_COMMUNITY)
Admission: RE | Admit: 2021-07-23 | Discharge: 2021-07-23 | Disposition: A | Discharge status: Home / Self Care | Payer: Managed Care, Other (non HMO) | Source: Ambulatory Visit

## 2021-07-23 VITALS — BP 125/73 | HR 97 | Wt 183.0 lb

## 2021-07-23 DIAGNOSIS — Z3480 Encounter for supervision of other normal pregnancy, unspecified trimester: Secondary | ICD-10-CM | POA: Insufficient documentation

## 2021-07-23 DIAGNOSIS — Z3A36 36 weeks gestation of pregnancy: Secondary | ICD-10-CM

## 2021-07-23 DIAGNOSIS — Z3483 Encounter for supervision of other normal pregnancy, third trimester: Secondary | ICD-10-CM

## 2021-07-23 LAB — OB RESULTS CONSOLE GBS: GBS: NEGATIVE

## 2021-07-23 NOTE — Progress Notes (Signed)
   PRENATAL VISIT NOTE  Subjective:  Jean Dickerson is a 36 y.o. G3P2002 at [redacted]w[redacted]d being seen today for ongoing prenatal care.  She is currently monitored for the following issues for this low-risk pregnancy and has Acne; Migraine without aura; History of thrush; and Supervision of other normal pregnancy, antepartum on their problem list.  Patient reports no complaints.  Contractions: Irritability. Vag. Bleeding: None.  Movement: Present. Denies leaking of fluid.   The following portions of the patient's history were reviewed and updated as appropriate: allergies, current medications, past family history, past medical history, past social history, past surgical history and problem list.   Objective:   Vitals:   07/23/21 1500  BP: 125/73  Pulse: 97  Weight: 183 lb (83 kg)    Fetal Status: Fetal Heart Rate (bpm): 145   Movement: Present  Presentation: Vertex  General:  Alert, oriented and cooperative. Patient is in no acute distress.  Skin: Skin is warm and dry. No rash noted.   Cardiovascular: Normal heart rate noted  Respiratory: Normal respiratory effort, no problems with respiration noted  Abdomen: Soft, gravid, appropriate for gestational age.  Pain/Pressure: Present     Pelvic: Cervical exam performed in the presence of a chaperone Dilation: 1 Effacement (%): 50 Station: Ballotable  Extremities: Normal range of motion.  Edema: Trace  Mental Status: Normal mood and affect. Normal behavior. Normal judgment and thought content.   Assessment and Plan:  Pregnancy: G3P2002 at [redacted]w[redacted]d 1. Supervision of other normal pregnancy, antepartum - Routine OB. Doing well, no concerns - Cultures today --Interested in waterbirth. Patient has completed class. Discussed waterbirth as option for low risk pregnancy. Pregnancy is hard to predict and things may arise that prevent immersion in water but labor can be supported with freedom of movement, birthing ball, shower and birth can be supported  in position of pt choosing, etc, even if waterbirth becomes unavailable. Pt states understanding.  - Culture, beta strep (group b only) - Cervicovaginal ancillary only( [place])  2. [redacted] weeks gestation of pregnancy - Endorses active fetal movement   Preterm labor symptoms and general obstetric precautions including but not limited to vaginal bleeding, contractions, leaking of fluid and fetal movement were reviewed in detail with the patient. Please refer to After Visit Summary for other counseling recommendations.   Return in about 1 week (around 07/30/2021).  Future Appointments  Date Time Provider Department Center  07/30/2021  2:10 PM Rasch, Harolyn Rutherford, NP [facility]  08/06/2021  3:10 PM Armando Reichert, CNM [facility]  08/13/2021  2:10 PM Brand Males, CNM [facility]    Brand Males, CNM

## 2021-07-24 LAB — CERVICOVAGINAL ANCILLARY ONLY
Chlamydia: NEGATIVE
Comment: NEGATIVE
Comment: NORMAL
Neisseria Gonorrhea: NEGATIVE

## 2021-07-26 ENCOUNTER — Encounter: Payer: Self-pay | Admitting: Advanced Practice Midwife

## 2021-07-26 LAB — CULTURE, BETA STREP (GROUP B ONLY)
MICRO NUMBER:: 13463830
SPECIMEN QUALITY:: ADEQUATE

## 2021-07-30 ENCOUNTER — Ambulatory Visit (INDEPENDENT_AMBULATORY_CARE_PROVIDER_SITE_OTHER): Payer: Managed Care, Other (non HMO) | Admitting: Obstetrics and Gynecology

## 2021-07-30 VITALS — BP 119/74 | HR 92 | Wt 187.0 lb

## 2021-07-30 DIAGNOSIS — Z3A37 37 weeks gestation of pregnancy: Secondary | ICD-10-CM

## 2021-07-30 DIAGNOSIS — Z3480 Encounter for supervision of other normal pregnancy, unspecified trimester: Secondary | ICD-10-CM

## 2021-07-30 NOTE — Progress Notes (Signed)
   PRENATAL VISIT NOTE  Subjective:  Jean Dickerson is a 36 y.o. G3P2002 at [redacted]w[redacted]d being seen today for ongoing prenatal care.  She is currently monitored for the following issues for this low-risk pregnancy and has Acne; Migraine without aura; History of thrush; and Supervision of other normal pregnancy, antepartum on their problem list.  Patient reports no complaints.  Contractions: Irritability. Vag. Bleeding: None.  Movement: Present. Denies leaking of fluid.   The following portions of the patient's history were reviewed and updated as appropriate: allergies, current medications, past family history, past medical history, past social history, past surgical history and problem list.   Objective:   Vitals:   07/30/21 1413  BP: 119/74  Pulse: 92  Weight: 187 lb (84.8 kg)    Fetal Status: Fetal Heart Rate (bpm): 158 Fundal Height: 36 cm Movement: Present  Presentation: Vertex  General:  Alert, oriented and cooperative. Patient is in no acute distress.  Skin: Skin is warm and dry. No rash noted.   Cardiovascular: Normal heart rate noted  Respiratory: Normal respiratory effort, no problems with respiration noted  Abdomen: Soft, gravid, appropriate for gestational age.  Pain/Pressure: Present     Pelvic: Cervical exam done.Dilation: 1 Effacement (%): 50 Station: Ballotable  Extremities: Normal range of motion.  Edema: Trace  Mental Status: Normal mood and affect. Normal behavior. Normal judgment and thought content.   Assessment and Plan:  Pregnancy: G3P2002 at [redacted]w[redacted]d 1. [redacted] weeks gestation of pregnancy Doing well Taking primrose oil. GBS negative  Plans water birth. Class taken with last pregnancy, per OB team, this is ok.   Term labor symptoms and general obstetric precautions including but not limited to vaginal bleeding, contractions, leaking of fluid and fetal movement were reviewed in detail with the patient. Please refer to After Visit Summary for other counseling  recommendations.   No follow-ups on file.  Future Appointments  Date Time Provider [place]  08/06/2021  3:10 PM Tresea Mall, CNM [facility]  08/13/2021  2:10 PM Renee Harder, CNM [facility]    Noni Saupe, NP

## 2021-08-06 ENCOUNTER — Encounter: Payer: Self-pay | Admitting: Advanced Practice Midwife

## 2021-08-06 ENCOUNTER — Ambulatory Visit (INDEPENDENT_AMBULATORY_CARE_PROVIDER_SITE_OTHER): Payer: Managed Care, Other (non HMO) | Admitting: Advanced Practice Midwife

## 2021-08-06 VITALS — BP 128/80 | HR 103 | Wt 190.0 lb

## 2021-08-06 DIAGNOSIS — Z3A38 38 weeks gestation of pregnancy: Secondary | ICD-10-CM

## 2021-08-06 DIAGNOSIS — Z3480 Encounter for supervision of other normal pregnancy, unspecified trimester: Secondary | ICD-10-CM

## 2021-08-06 NOTE — Progress Notes (Signed)
   PRENATAL VISIT NOTE  Subjective:  Jean Dickerson is a 36 y.o. G3P2002 at [redacted]w[redacted]d being seen today for ongoing prenatal care.  She is currently monitored for the following issues for this low-risk pregnancy and has Acne; Migraine without aura; History of thrush; and Supervision of other normal pregnancy, antepartum on their problem list.  Patient reports no complaints.  Contractions: Irritability. Vag. Bleeding: None.  Movement: Present. Denies leaking of fluid.   The following portions of the patient's history were reviewed and updated as appropriate: allergies, current medications, past family history, past medical history, past social history, past surgical history and problem list.   Objective:   Vitals:   08/06/21 1516  BP: 128/80  Pulse: (!) 103  Weight: 190 lb (86.2 kg)    Fetal Status: Fetal Heart Rate (bpm): 154 Fundal Height: 38 cm Movement: Present  Presentation: Vertex  General:  Alert, oriented and cooperative. Patient is in no acute distress.  Skin: Skin is warm and dry. No rash noted.   Cardiovascular: Normal heart rate noted  Respiratory: Normal respiratory effort, no problems with respiration noted  Abdomen: Soft, gravid, appropriate for gestational age.  Pain/Pressure: Present     Pelvic: Cervical exam performed in the presence of a chaperone Dilation: 1.5 Effacement (%): 60    Extremities: Normal range of motion.  Edema: Trace  Mental Status: Normal mood and affect. Normal behavior. Normal judgment and thought content.   Assessment and Plan:  Pregnancy: G3P2002 at [redacted]w[redacted]d 1. Supervision of other normal pregnancy, antepartum -routine care  2. [redacted] weeks gestation of pregnancy   Term labor symptoms and general obstetric precautions including but not limited to vaginal bleeding, contractions, leaking of fluid and fetal movement were reviewed in detail with the patient. Please refer to After Visit Summary for other counseling recommendations.   Return in  about 1 week (around 08/13/2021).  Future Appointments  Date Time Provider Department Center  08/13/2021  2:10 PM Brand Males, CNM [facility]   Thressa Sheller DNP, CNM  08/06/21  3:59 PM

## 2021-08-13 ENCOUNTER — Ambulatory Visit (INDEPENDENT_AMBULATORY_CARE_PROVIDER_SITE_OTHER): Payer: Managed Care, Other (non HMO)

## 2021-08-13 VITALS — BP 125/76 | HR 90 | Wt 191.0 lb

## 2021-08-13 DIAGNOSIS — Z3480 Encounter for supervision of other normal pregnancy, unspecified trimester: Secondary | ICD-10-CM

## 2021-08-13 DIAGNOSIS — Z3A39 39 weeks gestation of pregnancy: Secondary | ICD-10-CM

## 2021-08-13 NOTE — Progress Notes (Signed)
   PRENATAL VISIT NOTE  Subjective:  Jean Dickerson is a 36 y.o. G3P2002 at [redacted]w[redacted]d being seen today for ongoing prenatal care.  She is currently monitored for the following issues for this low-risk pregnancy and has Acne; Migraine without aura; History of thrush; and Supervision of other normal pregnancy, antepartum on their problem list.  Patient reports no complaints.  Contractions: Irritability. Vag. Bleeding: None.  Movement: Present. Denies leaking of fluid.   The following portions of the patient's history were reviewed and updated as appropriate: allergies, current medications, past family history, past medical history, past social history, past surgical history and problem list.   Objective:   Vitals:   08/13/21 1414  BP: 125/76  Pulse: 90  Weight: 191 lb (86.6 kg)    Fetal Status: Fetal Heart Rate (bpm): 156 Fundal Height: 39 cm Movement: Present  Presentation: Vertex  General:  Alert, oriented and cooperative. Patient is in no acute distress.  Skin: Skin is warm and dry. No rash noted.   Cardiovascular: Normal heart rate noted  Respiratory: Normal respiratory effort, no problems with respiration noted  Abdomen: Soft, gravid, appropriate for gestational age.  Pain/Pressure: Present     Pelvic: Cervical exam performed in the presence of a chaperone Dilation: 1.5 Effacement (%): 50 Station: Ballotable  Extremities: Normal range of motion.  Edema: Trace  Mental Status: Normal mood and affect. Normal behavior. Normal judgment and thought content.   Assessment and Plan:  Pregnancy: G3P2002 at [redacted]w[redacted]d 1. Supervision of other normal pregnancy, antepartum - Routine OB. Doing well, no concerns - Requesting cervical exam and membrane sweep today - Continue RRT, EPO - Desires waterbirth, would like to hold off on IOL-reviewed 41 week IOL recommendations - NST next week for PD  2. [redacted] weeks gestation of pregnancy - Endorses active fetal movement - FH appropriate  Term labor  symptoms and general obstetric precautions including but not limited to vaginal bleeding, contractions, leaking of fluid and fetal movement were reviewed in detail with the patient. Please refer to After Visit Summary for other counseling recommendations.   Return in about 1 week (around 08/20/2021) for LOB.  Future Appointments  Date Time Provider Department Center  08/20/2021  9:10 AM Rasch, Harolyn Rutherford, NP [facility]  08/20/2021  9:30 AM [facility] NURSE [facility]    Brand Males, CNM

## 2021-08-18 ENCOUNTER — Inpatient Hospital Stay (HOSPITAL_COMMUNITY): Admit: 2021-08-18 | Payer: Managed Care, Other (non HMO) | Admitting: Obstetrics and Gynecology

## 2021-08-18 ENCOUNTER — Inpatient Hospital Stay (HOSPITAL_COMMUNITY)
Admission: AD | Admit: 2021-08-18 | Discharge: 2021-08-19 | DRG: 807 | Disposition: A | Discharge status: Home / Self Care | Payer: Managed Care, Other (non HMO) | Attending: Obstetrics and Gynecology | Admitting: Obstetrics and Gynecology

## 2021-08-18 DIAGNOSIS — Z3480 Encounter for supervision of other normal pregnancy, unspecified trimester: Secondary | ICD-10-CM

## 2021-08-18 DIAGNOSIS — Z3A40 40 weeks gestation of pregnancy: Secondary | ICD-10-CM

## 2021-08-18 DIAGNOSIS — O26893 Other specified pregnancy related conditions, third trimester: Secondary | ICD-10-CM | POA: Diagnosis present

## 2021-08-18 DIAGNOSIS — O4202 Full-term premature rupture of membranes, onset of labor within 24 hours of rupture: Secondary | ICD-10-CM

## 2021-08-18 LAB — CBC
HCT: 32.5 % — ABNORMAL LOW (ref 36.0–46.0)
Hemoglobin: 11.1 g/dL — ABNORMAL LOW (ref 12.0–15.0)
MCH: 30.2 pg (ref 26.0–34.0)
MCHC: 34.2 g/dL (ref 30.0–36.0)
MCV: 88.3 fL (ref 80.0–100.0)
Platelets: 221 10*3/uL (ref 150–400)
RBC: 3.68 MIL/uL — ABNORMAL LOW (ref 3.87–5.11)
RDW: 13.1 % (ref 11.5–15.5)
WBC: 14 10*3/uL — ABNORMAL HIGH (ref 4.0–10.5)
nRBC: 0 % (ref 0.0–0.2)

## 2021-08-18 LAB — TYPE AND SCREEN
ABO/RH(D): A POS
Antibody Screen: NEGATIVE

## 2021-08-18 LAB — RPR: RPR Ser Ql: NONREACTIVE

## 2021-08-18 MED ORDER — OXYCODONE-ACETAMINOPHEN 5-325 MG PO TABS: 1.0000 | ORAL_TABLET | ORAL | Status: DC | PRN

## 2021-08-18 MED ORDER — DIPHENHYDRAMINE HCL 25 MG PO CAPS: 25.0000 mg | ORAL_CAPSULE | Freq: Four times a day (QID) | ORAL | Status: DC | PRN

## 2021-08-18 MED ORDER — SOD CITRATE-CITRIC ACID 500-334 MG/5ML PO SOLN: 30.0000 mL | ORAL | Status: DC | PRN

## 2021-08-18 MED ORDER — LIDOCAINE HCL (PF) 1 % IJ SOLN
30.0000 mL | INTRAMUSCULAR | Status: AC | PRN
  Administered 2021-08-18: 30 mL via SUBCUTANEOUS

## 2021-08-18 MED ORDER — OXYTOCIN-SODIUM CHLORIDE 30-0.9 UT/500ML-% IV SOLN: 2.5000 [IU]/h | INTRAVENOUS | Status: DC

## 2021-08-18 MED ORDER — LACTATED RINGERS IV SOLN: 500.0000 mL | INTRAVENOUS | Status: DC | PRN

## 2021-08-18 MED ORDER — IBUPROFEN 600 MG PO TABS
600.0000 mg | ORAL_TABLET | Freq: Four times a day (QID) | ORAL | Status: DC
  Administered 2021-08-18 – 2021-08-19 (×6): 600 mg via ORAL
  Filled 2021-08-18 (×6): qty 1

## 2021-08-18 MED ORDER — COCONUT OIL OIL: 1.0000 | TOPICAL_OIL | Status: DC | PRN

## 2021-08-18 MED ORDER — SIMETHICONE 80 MG PO CHEW: 80.0000 mg | CHEWABLE_TABLET | ORAL | Status: DC | PRN

## 2021-08-18 MED ORDER — OXYTOCIN-SODIUM CHLORIDE 30-0.9 UT/500ML-% IV SOLN
2.5000 [IU]/h | INTRAVENOUS | Status: DC
  Administered 2021-08-18: 2.5 [IU]/h via INTRAVENOUS

## 2021-08-18 MED ORDER — LACTATED RINGERS IV SOLN: INTRAVENOUS | Status: DC

## 2021-08-18 MED ORDER — ONDANSETRON HCL 4 MG/2ML IJ SOLN: 4.0000 mg | Freq: Four times a day (QID) | INTRAMUSCULAR | Status: DC | PRN

## 2021-08-18 MED ORDER — DIBUCAINE (PERIANAL) 1 % EX OINT: 1.0000 | TOPICAL_OINTMENT | CUTANEOUS | Status: DC | PRN

## 2021-08-18 MED ORDER — OXYCODONE-ACETAMINOPHEN 5-325 MG PO TABS: 2.0000 | ORAL_TABLET | ORAL | Status: DC | PRN

## 2021-08-18 MED ORDER — OXYTOCIN BOLUS FROM INFUSION: 333.0000 mL | Freq: Once | INTRAVENOUS | Status: DC

## 2021-08-18 MED ORDER — ONDANSETRON HCL 4 MG PO TABS: 4.0000 mg | ORAL_TABLET | ORAL | Status: DC | PRN

## 2021-08-18 MED ORDER — OXYTOCIN BOLUS FROM INFUSION
333.0000 mL | Freq: Once | INTRAVENOUS | Status: AC
  Administered 2021-08-18: 333 mL via INTRAVENOUS

## 2021-08-18 MED ORDER — ONDANSETRON HCL 4 MG/2ML IJ SOLN
4.0000 mg | Freq: Four times a day (QID) | INTRAMUSCULAR | Status: DC | PRN
Start: 2021-08-18 — End: 2021-08-18

## 2021-08-18 MED ORDER — ACETAMINOPHEN 325 MG PO TABS: 650.0000 mg | ORAL_TABLET | ORAL | Status: DC | PRN

## 2021-08-18 MED ORDER — ONDANSETRON HCL 4 MG/2ML IJ SOLN: 4.0000 mg | INTRAMUSCULAR | Status: DC | PRN

## 2021-08-18 MED ORDER — FLEET ENEMA 7-19 GM/118ML RE ENEM
1.0000 | ENEMA | RECTAL | Status: DC | PRN
Start: 2021-08-18 — End: 2021-08-18

## 2021-08-18 MED ORDER — FENTANYL CITRATE (PF) 100 MCG/2ML IJ SOLN
50.0000 ug | INTRAMUSCULAR | Status: DC | PRN
  Administered 2021-08-18: 50 ug via INTRAVENOUS
  Filled 2021-08-18: qty 2

## 2021-08-18 MED ORDER — TETANUS-DIPHTH-ACELL PERTUSSIS 5-2.5-18.5 LF-MCG/0.5 IM SUSY: 0.5000 mL | PREFILLED_SYRINGE | Freq: Once | INTRAMUSCULAR | Status: DC

## 2021-08-18 MED ORDER — LIDOCAINE HCL (PF) 1 % IJ SOLN: 30.0000 mL | INTRAMUSCULAR | Status: DC | PRN

## 2021-08-18 MED ORDER — LIDOCAINE HCL (PF) 1 % IJ SOLN
INTRAMUSCULAR | Status: AC
  Filled 2021-08-18: qty 30

## 2021-08-18 MED ORDER — BENZOCAINE-MENTHOL 20-0.5 % EX AERO
1.0000 | INHALATION_SPRAY | CUTANEOUS | Status: DC | PRN
  Administered 2021-08-18: 1 via TOPICAL
  Filled 2021-08-18: qty 56

## 2021-08-18 MED ORDER — WITCH HAZEL-GLYCERIN EX PADS: 1.0000 | MEDICATED_PAD | CUTANEOUS | Status: DC | PRN

## 2021-08-18 MED ORDER — ZOLPIDEM TARTRATE 5 MG PO TABS: 5.0000 mg | ORAL_TABLET | Freq: Every evening | ORAL | Status: DC | PRN

## 2021-08-18 MED ORDER — SENNOSIDES-DOCUSATE SODIUM 8.6-50 MG PO TABS
2.0000 | ORAL_TABLET | ORAL | Status: DC
  Administered 2021-08-18 – 2021-08-19 (×2): 2 via ORAL
  Filled 2021-08-18 (×2): qty 2

## 2021-08-18 MED ORDER — ACETAMINOPHEN 325 MG PO TABS
650.0000 mg | ORAL_TABLET | ORAL | Status: DC | PRN
  Administered 2021-08-18 – 2021-08-19 (×3): 650 mg via ORAL
  Filled 2021-08-18 (×3): qty 2

## 2021-08-18 MED ORDER — PRENATAL MULTIVITAMIN CH
1.0000 | ORAL_TABLET | Freq: Every day | ORAL | Status: DC
  Administered 2021-08-18 – 2021-08-19 (×2): 1 via ORAL
  Filled 2021-08-18 (×2): qty 1

## 2021-08-18 MED ORDER — OXYTOCIN-SODIUM CHLORIDE 30-0.9 UT/500ML-% IV SOLN
INTRAVENOUS | Status: AC
  Filled 2021-08-18: qty 500

## 2021-08-19 MED ORDER — IBUPROFEN 600 MG PO TABS: 600.0000 mg | ORAL_TABLET | Freq: Four times a day (QID) | ORAL | 0 refills | Status: AC | PRN

## 2021-08-19 MED ORDER — ACETAMINOPHEN 500 MG PO TABS: 1000.0000 mg | ORAL_TABLET | Freq: Three times a day (TID) | ORAL | 0 refills | Status: AC | PRN

## 2021-08-19 NOTE — Social Work (Signed)
CSW acknowledged consult and completed a clinical assessment.  There are no barriers to d/c.  Clinical assessment notes will be entered at a later time.  Vivi Barrack, MSW, LCSW [facility] and [facility]  Clinical Social Worker  [phone] 08/19/2021  12:09 PM

## 2021-08-19 NOTE — Lactation Note (Signed)
This note was copied from a baby's chart. Lactation Consultation Note  Patient Name: Jean Dickerson [id number] Date: 08/19/2021   Age:35 hours  LC attempted to visit with mom per her request, but she was in the shower. Will follow-up later.   [name] P [name] 08/19/2021, 11:09 AM

## 2021-08-19 NOTE — Social Work (Signed)
CSW received consult for hx of Anxiety, Depression and [place] 12.  CSW met with MOB to offer support and complete assessment.    CSW met with MOB at bedside and introduced CSW role. CSW observed MOB in bed and FOB up in the room changing the infant's diaper. MOB presented calm and welcomed CSW visit. MOB gave CSW permission to share all information with FOB present. CSW inquired how MOB has felt since giving birth. MOB reported feeling "much better" and shared the events of the delivery but overall felt in the end "it was great."  MOB reported during the pregnancy, "I was on the struggle bus." MOB reported that she and FOB were not planning to have any more children, so it was a surprise. MOB reported now that the baby has arrives, she feels much better. CSW discussed MOB [place]. MOB reported the past seven days she been waiting and feeling discouraged about the birth of the infant. MOB reported that in the past she has had her babies before the 40 weeks, so the waiting felt very long. MOB reported that she also had physical pain from hemorrhoids. MOB acknowledged that she a history of anxiety and depression that was diagnosed in 2010 while in college. MOB reported that she still has anxiety and had PP anxiety after the birth of her oldest child. MOB expressed that she has been interested in therapy however her insurance requires a high copay. CSW provided MOB with a list of additional mental health resources with sliding scale fees available. CSW informed MOB about [facility] and encouraged her to follow up. MOB was very Adult nurse. CSW inquired about MOB coping strategies. CSW expressed to feel better she likes to talk about what is worrying her. CSW inquired about MOB supports. MOB expressed that she has a lot of support from her mom that lives in the home, her spouse, cousin, friends, and MIL.   CSW provided education regarding the baby blues period vs. perinatal mood disorders, discussed treatment. CSW  recommended MOB complete a self-evaluation during the postpartum time period using the New Mom Checklist from Postpartum Progress and encouraged MOB to contact a medical professional if symptoms are noted at any time. CSW assessed MOB for safety. MOB denied thoughts of harm to self and others.   MOB reported she has all essential items for the infant including a bassinet where the infant will sleep. CSW provided review of Sudden Infant Death Syndrome (SIDS) precautions. MOB has chosen [facility] for the infant's follow up care. CSW assessed MOB for additional needs. MOB reported no further need.    CSW identifies no further need for intervention and no barriers to discharge at this time.  Vivi Barrack, MSW, LCSW Women[facility] and [facility]  Clinical Social Worker  [phone] 08/19/2021

## 2021-08-20 ENCOUNTER — Encounter: Payer: Managed Care, Other (non HMO) | Admitting: Obstetrics and Gynecology

## 2021-08-20 ENCOUNTER — Other Ambulatory Visit: Payer: Managed Care, Other (non HMO)

## 2021-08-21 ENCOUNTER — Telehealth (HOSPITAL_COMMUNITY): Payer: Self-pay | Admitting: *Deleted

## 2021-08-21 DIAGNOSIS — Z1331 Encounter for screening for depression: Secondary | ICD-10-CM

## 2021-08-21 NOTE — Telephone Encounter (Signed)
During hospital stay, patient scored 12 on EPDS. SW consult was completed and referral now being made for ambulatory IBH. Dr. Alvester Morin notified via chart.   Duffy Rhody, RN 08-21-2021 at 12:17pm

## 2021-08-25 ENCOUNTER — Other Ambulatory Visit: Payer: Self-pay | Admitting: Family Medicine

## 2021-09-09 ENCOUNTER — Telehealth: Payer: Self-pay | Admitting: Clinical

## 2021-09-09 NOTE — Telephone Encounter (Signed)
Attempt to call regarding referral; Left HIPPA-compliant message to call back Asher Muir from [employer] for [employer] at [facility] at  [phone] [facility] office).

## 2021-09-17 NOTE — BH Specialist Note (Deleted)
Integrated Behavioral Health via Telemedicine Visit  09/17/2021 Jean Dickerson [id number]  Number of Integrated Behavioral Health Clinician visits: No data recorded Session Start time: No data recorded  Session End time: No data recorded Total time in minutes: No data recorded  Referring Provider: *** Patient/Family location: *** [facility] Provider location: *** All persons participating in visit: *** Types of Service: {CHL AMB TYPE OF [phone]}  I connected with Jean Dickerson and/or Jean Dickerson's {family members:20773} via  Telephone or Video Enabled Telemedicine Application  (Video is Caregility application) and verified that I am speaking with the correct person using two identifiers. Discussed confidentiality: {YES/NO:21197}  I discussed the limitations of telemedicine and the availability of in person appointments.  Discussed there is a possibility of technology failure and discussed alternative modes of communication if that failure occurs.  I discussed that engaging in this telemedicine visit, they consent to the provision of behavioral healthcare and the services will be billed under their insurance.  Patient and/or legal guardian expressed understanding and consented to Telemedicine visit: {YES/NO:21197}  Presenting Concerns: Patient and/or family reports the following symptoms/concerns: *** Duration of problem: ***; Severity of problem: {Mild/Moderate/Severe:20260}  Patient and/or Family's Strengths/Protective Factors: {CHL AMB BH PROTECTIVE FACTORS:[phone]}  Goals Addressed: Patient will:  Reduce symptoms of: {IBH Symptoms:21014056}   Increase knowledge and/or ability of: {IBH Patient Tools:21014057}   Demonstrate ability to: {IBH Goals:21014053}  Progress towards Goals: {CHL AMB BH PROGRESS TOWARDS GOALS:[phone]}  Interventions: Interventions utilized:  {IBH Interventions:21014054} Standardized Assessments completed: {IBH  Screening Tools:21014051}  Patient and/or Family Response: ***  Assessment: Patient currently experiencing ***.   Patient may benefit from ***.  Plan: Follow up with behavioral health clinician on : *** Behavioral recommendations: *** Referral(s): {IBH Referrals:21014055}  I discussed the assessment and treatment plan with the patient and/or parent/guardian. They were provided an opportunity to ask questions and all were answered. They agreed with the plan and demonstrated an understanding of the instructions.   They were advised to call back or seek an in-person evaluation if the symptoms worsen or if the condition fails to improve as anticipated.  Valetta Close Ailen Strauch, LCSW

## 2021-09-20 NOTE — Progress Notes (Unsigned)
    Post Partum Visit Note  Jean Dickerson is a 36 y.o. G54P2002 female who presents for a postpartum visit. She is 4 weeks postpartum following a normal spontaneous vaginal delivery.  I have fully reviewed the prenatal and intrapartum course. The delivery was at 40 gestational weeks.  Anesthesia: {anesthesia types:812}. Postpartum course has been ***. Baby is doing well***. Baby is feeding by {breastmilk/bottle:69}. Bleeding {vag bleed:12292}. Bowel function is {normal:32111}. Bladder function is {normal:32111}. Patient {is/is not:9024} sexually active. Contraception method is {contraceptive method:5051}. Postpartum depression screening: negative.   The pregnancy intention screening data noted above was reviewed. Potential methods of contraception were discussed. The patient elected to proceed with No data recorded.    Health Maintenance Due  Topic Date Due   COVID-19 Vaccine (1) Never done    {Common ambulatory SmartLinks:19316}  Review of Systems {ros; complete:30496}  Objective:  LMP 11/16/2020    General:  {gen appearance:16600}   Breasts:  {desc; normal/abnormal/not indicated:14647}  Lungs: {lung exam:16931}  Heart:  {heart exam:5510}  Abdomen: {abdomen exam:16834}   Wound {Wound assessment:11097}  GU exam:  {desc; normal/abnormal/not indicated:14647}       Assessment:    There are no diagnoses linked to this encounter.  *** postpartum exam.   Plan:   Essential components of care per ACOG recommendations:  1.  Mood and well being: Patient with {gen negative/positive:315881} depression screening today. Reviewed local resources for support.  - Patient tobacco use? {tobacco use:25506}  - hx of drug use? {yes/no:25505}    2. Infant care and feeding:  -Patient currently breastmilk feeding? {yes/no:25502}  -Social determinants of health (SDOH) reviewed in EPIC. No concerns***The following needs were identified***  3. Sexuality, contraception and birth spacing -  Patient {DOES_DOES YJE:56314} want a pregnancy in the next year.  Desired family size is {NUMBER 1-10:22536} children.  - Reviewed reproductive life planning. Reviewed contraceptive methods based on pt preferences and effectiveness.  Patient desired {Upstream End Methods:24109} today.   - Discussed birth spacing of 18 months  4. Sleep and fatigue -Encouraged family/partner/community support of 4 hrs of uninterrupted sleep to help with mood and fatigue  5. Physical Recovery  - Discussed patients delivery and complications. She describes her labor as {description:25511} - Patient had a {CHL AMB [phone]}. Patient had a {laceration:25518} laceration. Perineal healing reviewed. Patient expressed understanding - Patient has urinary incontinence? {yes/no:25515} - Patient {ACTION; IS/IS HFW:[phone]} safe to resume physical and sexual activity  6.  Health Maintenance - HM due items addressed {Yes or If no, why not?:20788} - Last pap smear  Diagnosis  Date Value Ref Range Status  08/18/2019   Final   - Negative for intraepithelial lesion or malignancy (NILM)   Pap smear {done:10129} at today's visit.  -Breast Cancer screening indicated? {indicated:25516}  7. Chronic Disease/Pregnancy Condition follow up: {Follow up:25499}  - PCP follow up  Kathie Dike, CMA Center for [employer], [facility]

## 2021-09-23 ENCOUNTER — Encounter: Payer: Self-pay | Admitting: Obstetrics & Gynecology

## 2021-09-23 ENCOUNTER — Ambulatory Visit (INDEPENDENT_AMBULATORY_CARE_PROVIDER_SITE_OTHER): Payer: Managed Care, Other (non HMO) | Admitting: Obstetrics & Gynecology

## 2021-11-17 IMAGING — MG DIGITAL DIAGNOSTIC BILAT W/ TOMO W/ CAD
8 series · 8 of 24 positions shown · non-contrast
Comparison: Previous exam(s).

CLINICAL DATA: 34-year-old female for delayed follow-up of RIGHT
symptoms have resolved and no new palpable abnormalities are noted.

EXAM:
DIGITAL DIAGNOSTIC BILATERAL MAMMOGRAM WITH CAD AND TOMO

[L CC synth-2D]
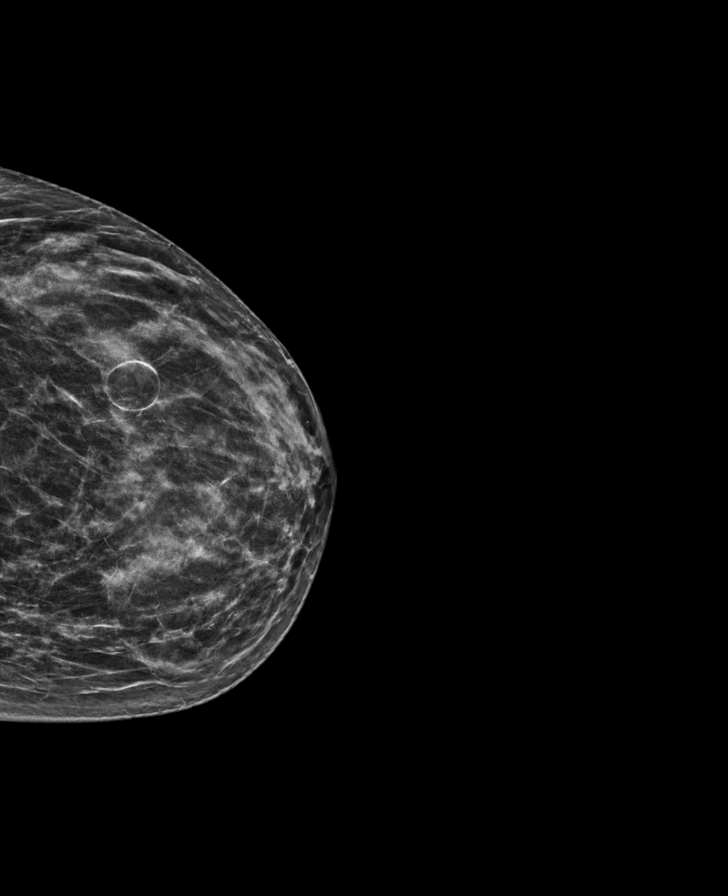

[R CC synth-2D]
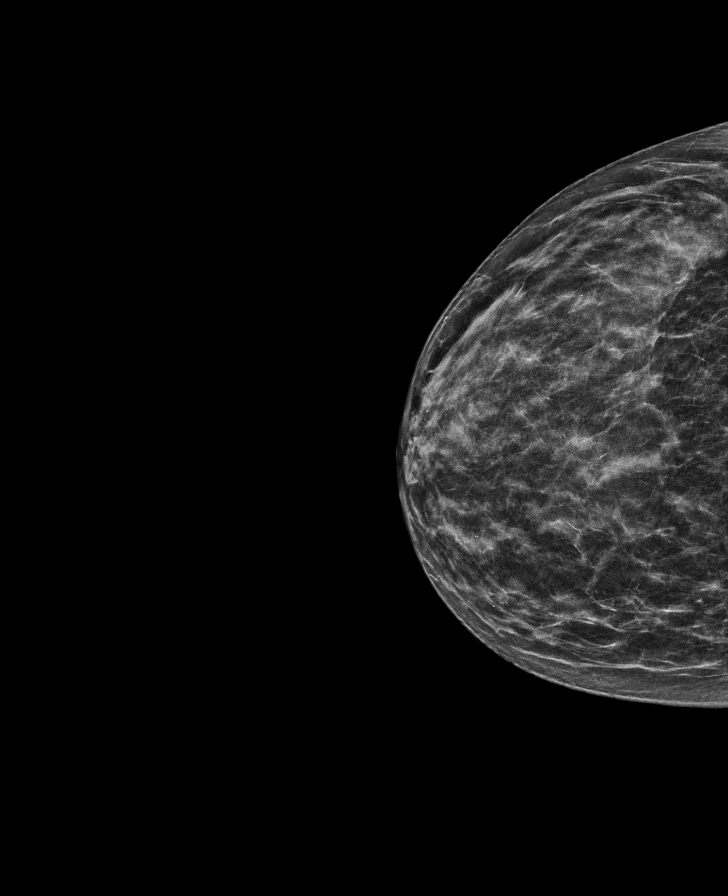

[R MLO synth-2D]
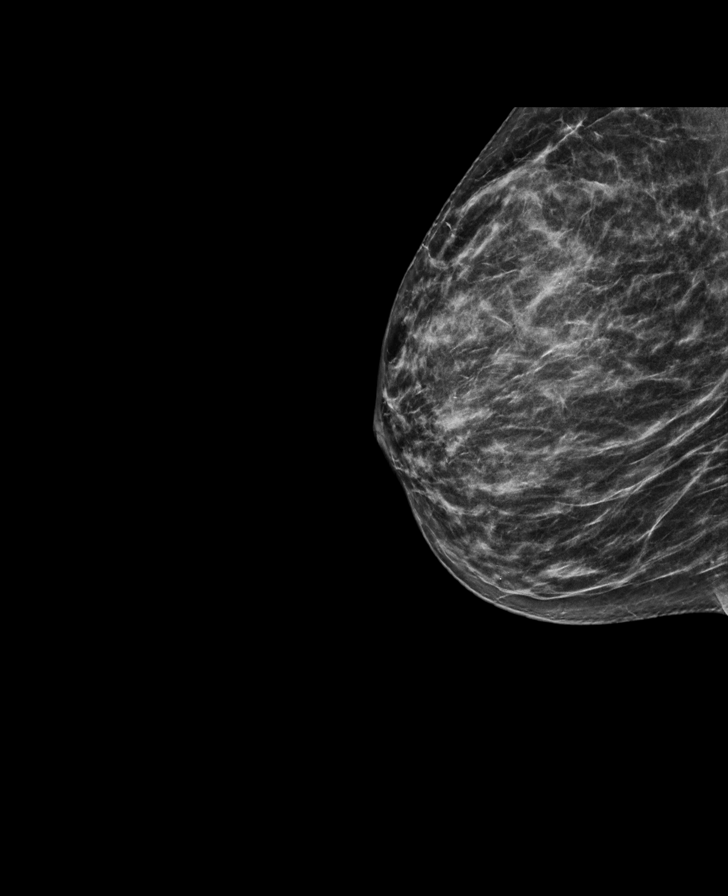

[L MLO synth-2D]
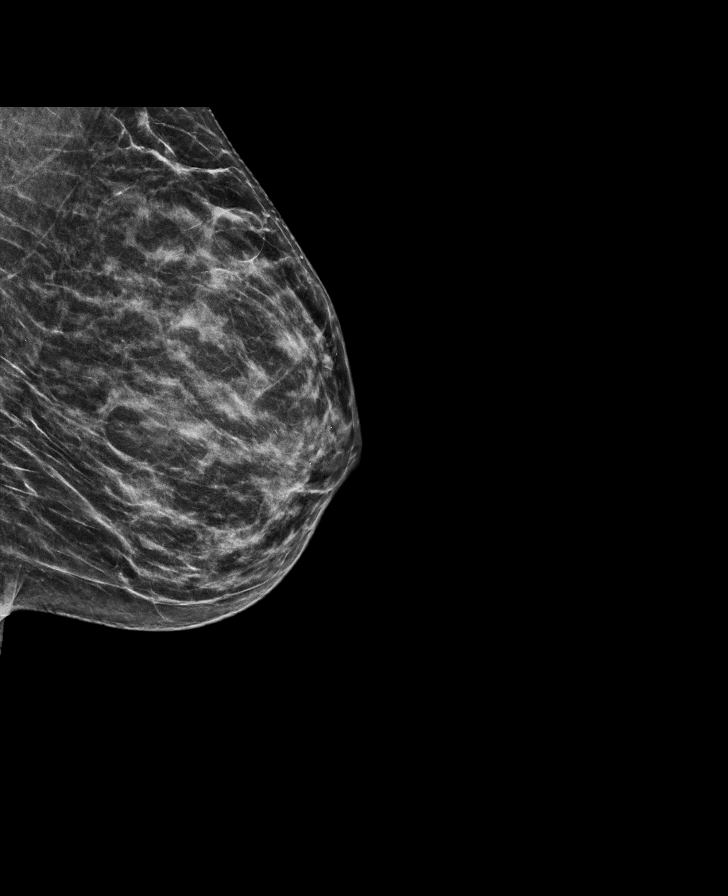

[R CC tomo · tomo slice 29/56.0]
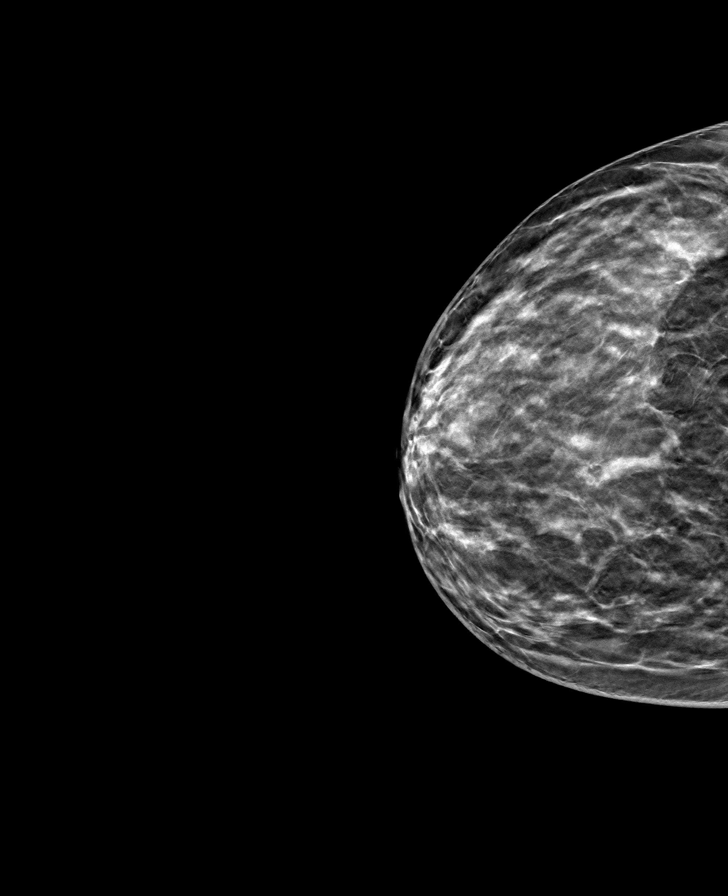

[L MLO tomo · tomo slice 29/56.0]
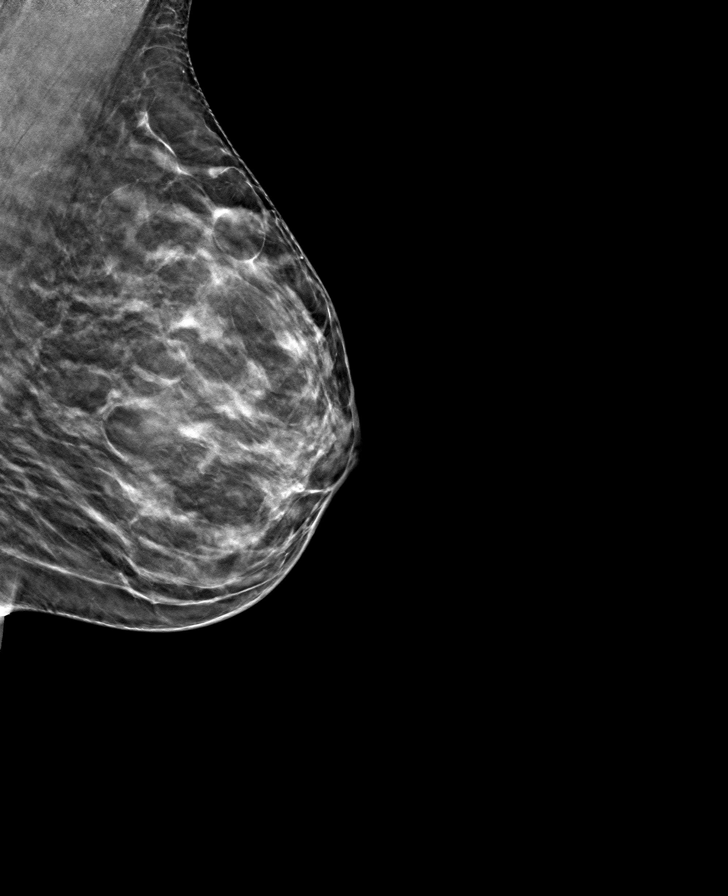

[R MLO tomo · tomo slice 29/56.0]
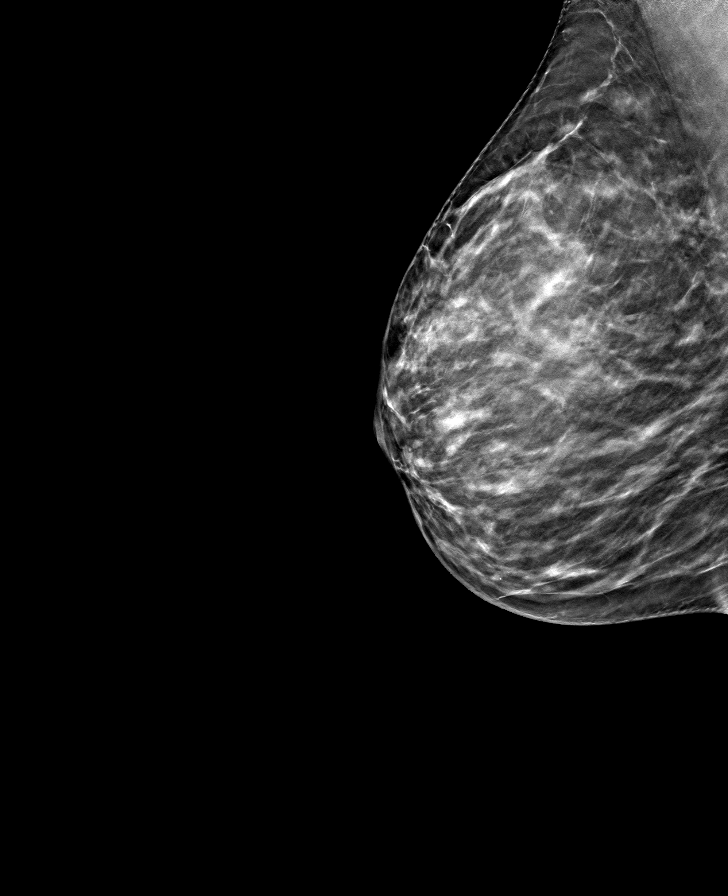

[L CC tomo · tomo slice 29/56.0]
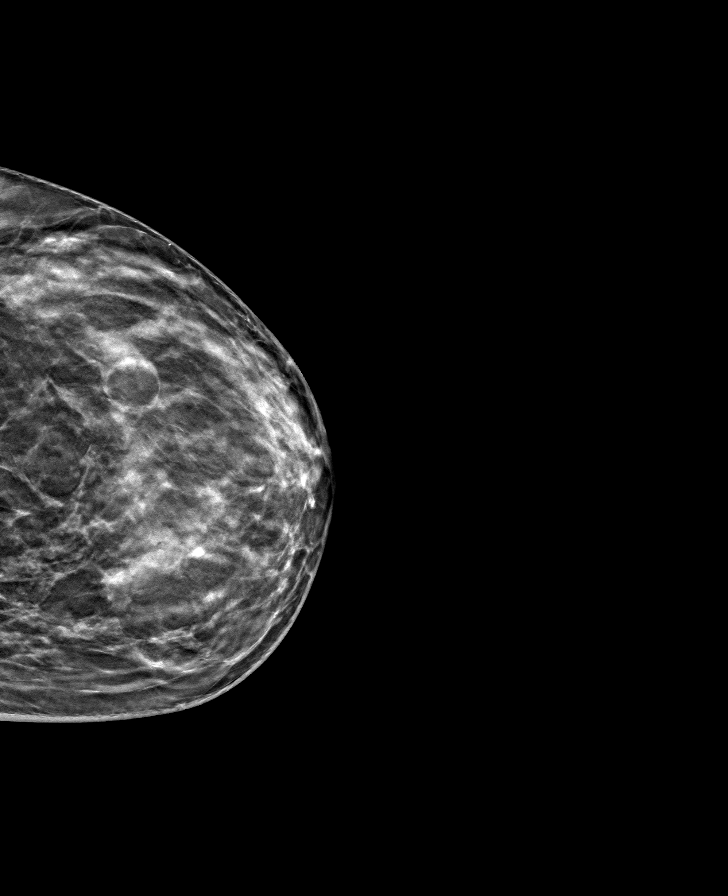

[8 of 24 positions shown; findings below may reference images not displayed]

ACR Breast Density Category c: The breast tissue is heterogeneously
dense, which may obscure small masses.
FINDINGS: 2D/3D full field views of both breasts demonstrate no suspicious
mass, distortion or worrisome calcifications.

A benign oil cyst within the UPPER-OUTER LEFT breast is present.

Mammographic images were processed with CAD.
IMPRESSION: No mammographic evidence of breast malignancy.

RECOMMENDATION:
Bilateral screening mammogram at age 40 unless otherwise indicated.

I have discussed the findings and recommendations with the patient.
If applicable, a reminder letter will be sent to the patient
regarding the next appointment.

BI-RADS CATEGORY  2: Benign.

## 2022-04-28 IMAGING — US US MFM OB COMP +14 WKS
1 series · 13 of 28 positions shown · non-contrast
Comparison: none

[Series 1: us mfm ob comp +14 wks · 97 acquisitions, 13 frames shown]
[im 4/97]
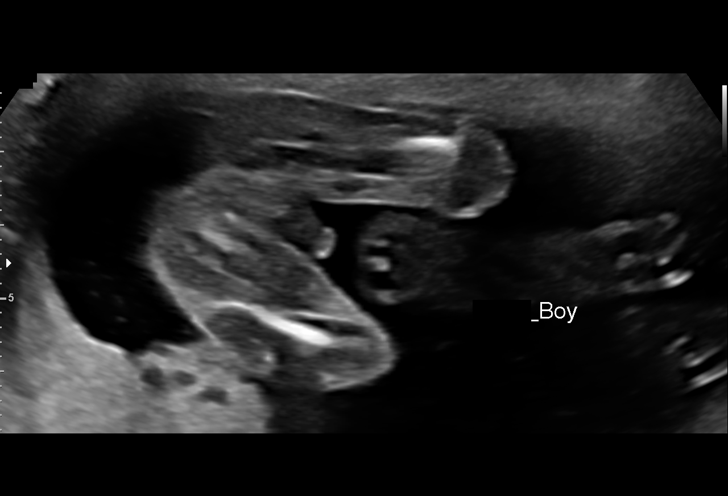
[im 11/97]
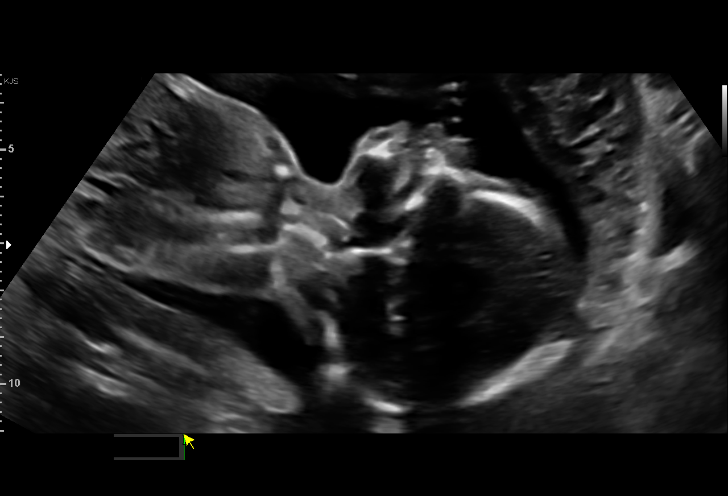
[im 18/97]
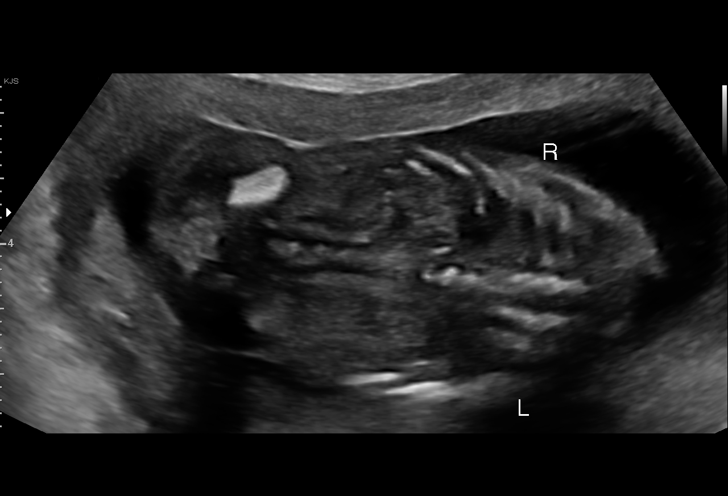
[im 25/97]
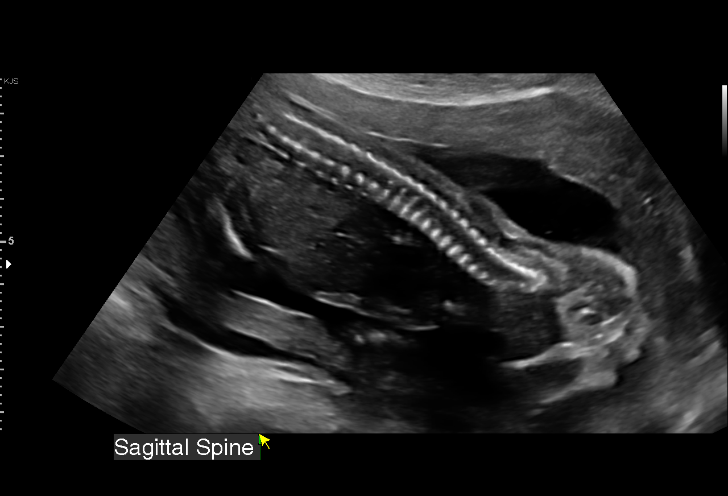
[im 33/97]
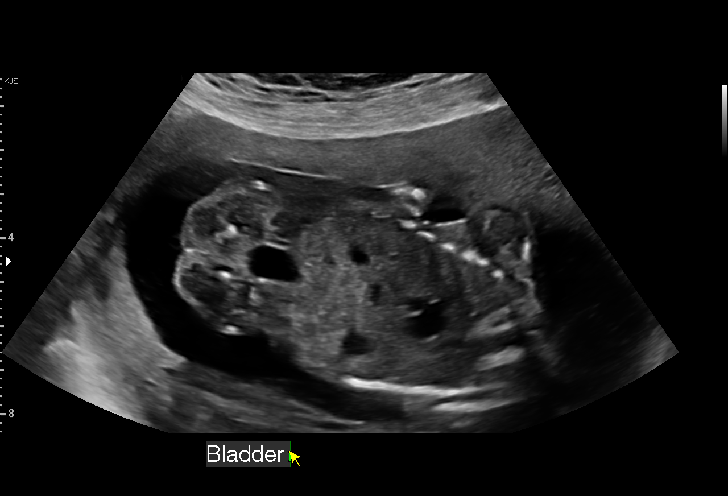
[im 40/97]
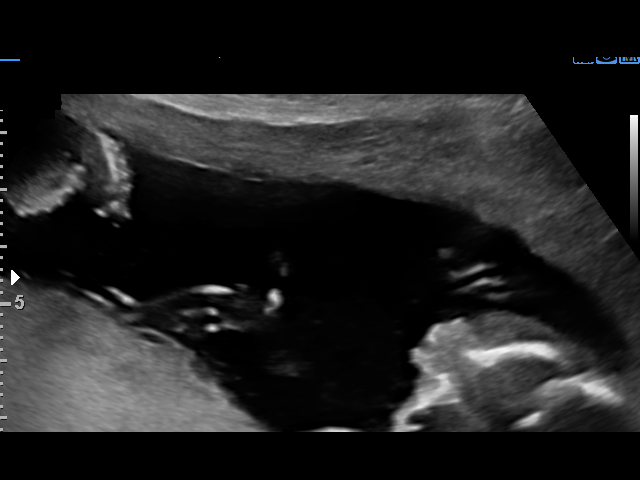
[im 50/97]
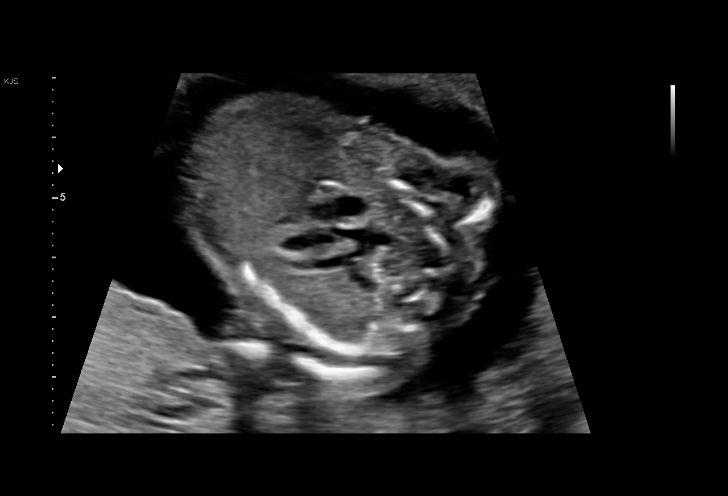
[im 57/97]
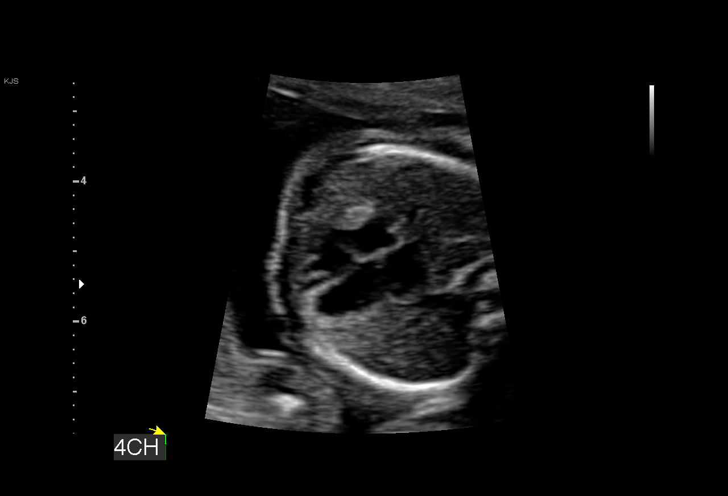
[im 65/97]
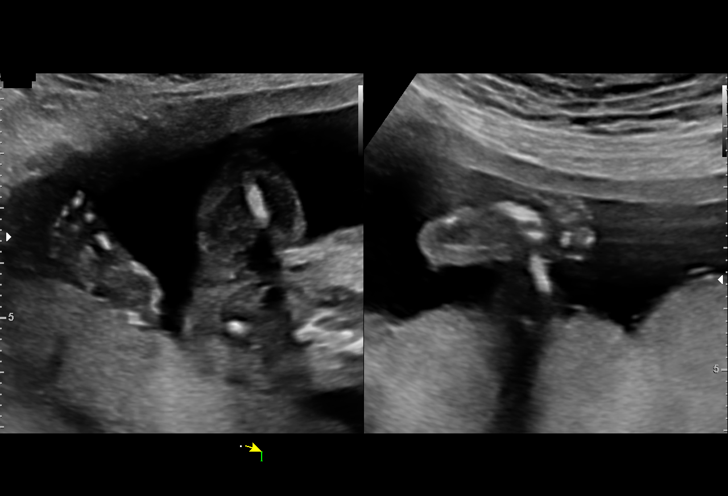
[im 72/97]
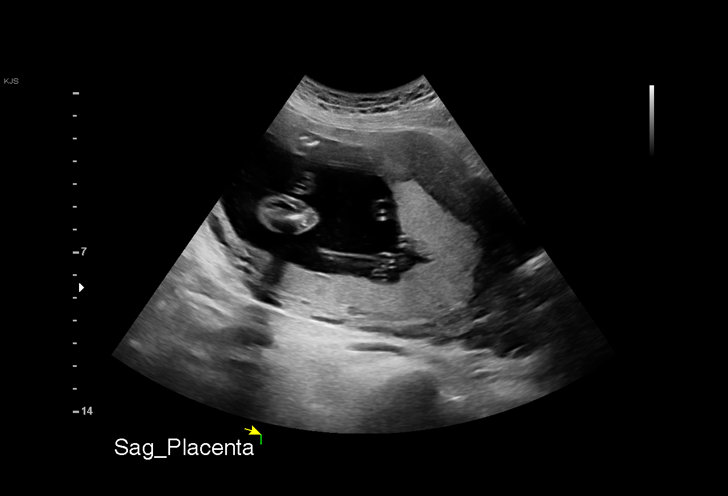
[im 79/97]
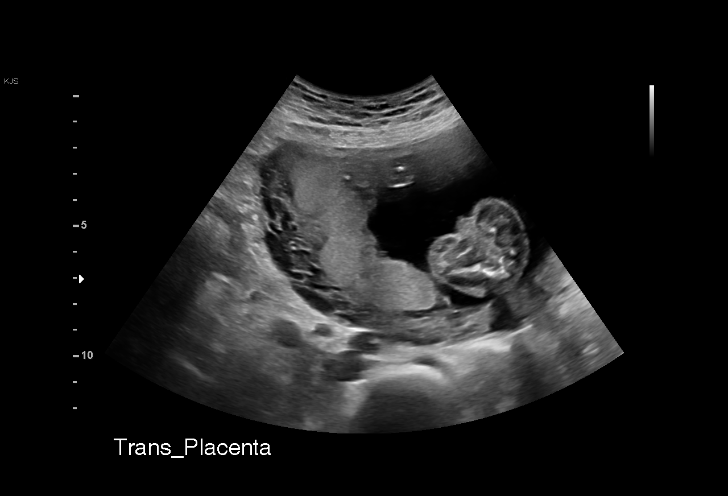
[im 86/97]
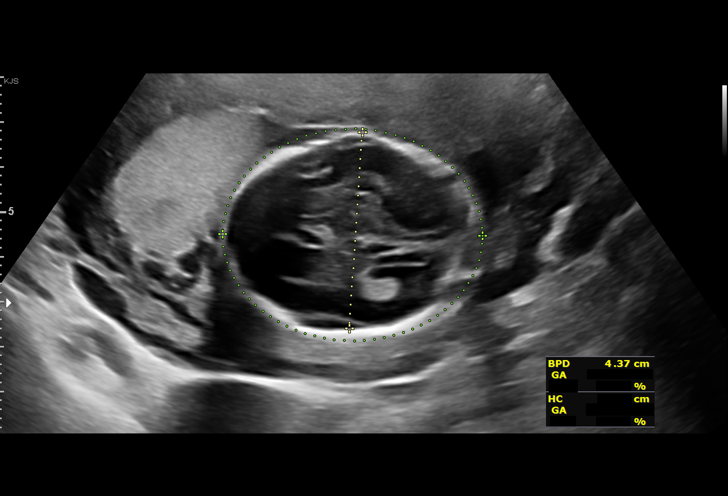
[im 93/97]
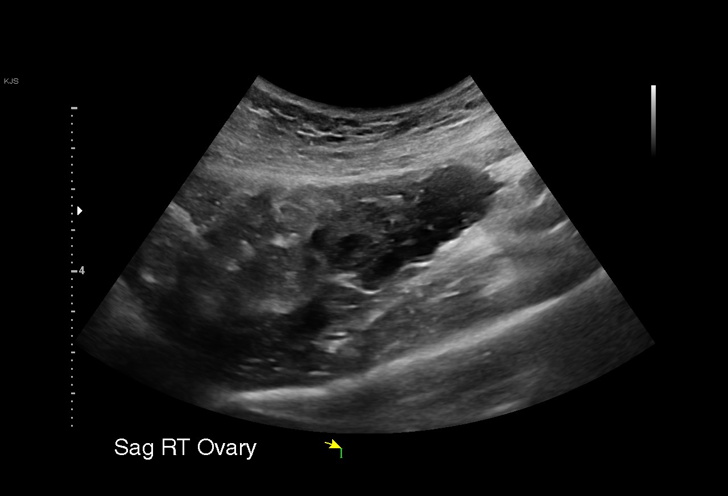

[13 of 28 positions shown; findings below may reference images not displayed]

Indications

 Fetal abnormality - other known or suspected
 Encounter for antenatal screening for
 malformations
 19 weeks gestation of pregnancy
 Declined Genetic Screening (Negative
 [facility] from 2595)
Fetal Evaluation

 Num Of Fetuses:         1
 Fetal Heart Rate(bpm):  153
 Cardiac Activity:       Observed
 Presentation:           Cephalic
 Placenta:               Right lateral
 P. Cord Insertion:      Visualized, central

 Amniotic Fluid
 AFI FV:      Within normal limits

                             Largest Pocket(cm)

Biometry

 BPD:        44  mm     G. Age:  19w 2d         51  %    CI:        70.91   %    70 - 86
                                                         FL/HC:      16.0   %    16.1 -
 HC:      166.5  mm     G. Age:  19w 2d         45  %    HC/AC:      1.20        1.09 -
 AC:      139.2  mm     G. Age:  19w 2d         47  %    FL/BPD:     60.7   %
 FL:       26.7  mm     G. Age:  18w 1d         10  %    FL/AC:      19.2   %    20 - 24
 HUM:      25.9  mm     G. Age:  18w 1d         20  %
 CER:      19.7  mm     G. Age:  19w 1d         41  %

 LV:        6.9  mm
 CM:        3.3  mm

 Est. FW:     260  gm      0 lb 9 oz     22  %
OB History

 Gravidity:    3         Term:   2        Prem:   0        SAB:   0
 TOP:          0       Ectopic:  0        Living: 2
Gestational Age

 LMP:           18w 4d        Date:  11/16/20                 EDD:   08/23/21
 U/S Today:     19w 0d                                        EDD:   08/20/21
 Best:          19w 2d     Det. By:  Early Ultrasound         EDD:   08/18/21
                                     (02/05/21)
Anatomy

 Cranium:               Appears normal         LVOT:                   Not well visualized
 Cavum:                 Appears normal         Aortic Arch:            Not well visualized
 Ventricles:            Appears normal         Ductal Arch:            Appears normal
 Choroid Plexus:        Appears normal         Diaphragm:              Appears normal
 Cerebellum:            Appears normal         Stomach:                Appears normal, left
                                                                       sided
 Posterior Fossa:       Appears normal         Abdomen:                Appears normal
 Nuchal Fold:           Appears normal         Abdominal Wall:         Appears nml (cord
                                                                       insert, abd wall)
 Face:                  Appears normal         Cord Vessels:           Appears normal (3
                        (orbits and profile)                           vessel cord)
 Lips:                  Appears normal         Kidneys:                Appear normal
 Palate:                Appears normal         Bladder:                Appears normal
 Thoracic:              Appears normal         Spine:                  Limited views
                                                                       appear normal
 Heart:                 Appears normal         Upper Extremities:      Appears normal
                        (4CH, axis, and
                        situs)
 RVOT:                  Appears normal         Lower Extremities:      Appears normal

 Other:  Fetus appears to be a male. Heels/feet and open hands/5th digits
         visualized. Lenses visualized. VC, 3VV and 3VTV visualized.
         Technically difficult due to fetal position.
Cervix Uterus Adnexa

 Cervix
 Length:           3.63  cm.
 Normal appearance by transabdominal scan.

 Uterus
 No abnormality visualized.

 Right Ovary
 Within normal limits.
 Left Ovary
 No adnexal mass visualized.

 Cul De Sac
 No free fluid seen.

 Adnexa
 No adnexal mass visualized.
Comments

 This patient was seen for a detailed fetal anatomy scan.
 She denies any significant past medical history and denies
 any problems in her current pregnancy.
 She has declined all screening tests for fetal aneuploidy in
 her current pregnancy.
 She was informed that the fetal growth and amniotic fluid
 level were appropriate for her gestational age.
 There were no obvious fetal anomalies noted on today's
 ultrasound exam.  However, the views of the fetal anatomy
 were limited today due to the fetal position.
 The patient was informed that anomalies may be missed due
 to technical limitations. If the fetus is in a suboptimal position
 or maternal habitus is increased, visualization of the fetus in
 the maternal uterus may be impaired.
 A follow-up exam was scheduled in 4 weeks to complete the
 views of the fetal anatomy.

## 2022-09-09 ENCOUNTER — Telehealth: Payer: Self-pay | Admitting: *Deleted

## 2022-09-09 NOTE — Telephone Encounter (Signed)
Left patient a message to call and schedule annual.
